# Patient Record
Sex: Female | Born: 1935 | Race: White | Hispanic: No | Marital: Married | Smoking: Never smoker
Health system: Southern US, Community
[De-identification: ages and names within clinical notes are randomized; demographics above are authoritative.]

## PROBLEM LIST (undated history)

## (undated) DIAGNOSIS — M810 Age-related osteoporosis without current pathological fracture: Secondary | ICD-10-CM

## (undated) DIAGNOSIS — J45909 Unspecified asthma, uncomplicated: Secondary | ICD-10-CM

## (undated) DIAGNOSIS — I341 Nonrheumatic mitral (valve) prolapse: Secondary | ICD-10-CM

## (undated) DIAGNOSIS — G20A1 Parkinson's disease without dyskinesia, without mention of fluctuations: Secondary | ICD-10-CM

## (undated) DIAGNOSIS — F419 Anxiety disorder, unspecified: Secondary | ICD-10-CM

## (undated) DIAGNOSIS — M419 Scoliosis, unspecified: Secondary | ICD-10-CM

## (undated) DIAGNOSIS — M1990 Unspecified osteoarthritis, unspecified site: Secondary | ICD-10-CM

## (undated) DIAGNOSIS — G20 Parkinson's disease: Secondary | ICD-10-CM

## (undated) DIAGNOSIS — I340 Nonrheumatic mitral (valve) insufficiency: Secondary | ICD-10-CM

## (undated) DIAGNOSIS — E785 Hyperlipidemia, unspecified: Secondary | ICD-10-CM

## (undated) DIAGNOSIS — Z889 Allergy status to unspecified drugs, medicaments and biological substances status: Secondary | ICD-10-CM

## (undated) HISTORY — DX: Scoliosis, unspecified: M41.9

## (undated) HISTORY — DX: Unspecified asthma, uncomplicated: J45.909

## (undated) HISTORY — DX: Hyperlipidemia, unspecified: E78.5

## (undated) HISTORY — PX: APPENDECTOMY: SHX54

## (undated) HISTORY — DX: Nonrheumatic mitral (valve) insufficiency: I34.0

## (undated) HISTORY — DX: Parkinson's disease: G20

## (undated) HISTORY — PX: HAMMER TOE SURGERY: SHX385

## (undated) HISTORY — DX: Parkinson's disease without dyskinesia, without mention of fluctuations: G20.A1

## (undated) HISTORY — PX: CHOLECYSTECTOMY: SHX55

## (undated) HISTORY — DX: Nonrheumatic mitral (valve) prolapse: I34.1

## (undated) HISTORY — DX: Unspecified osteoarthritis, unspecified site: M19.90

## (undated) HISTORY — PX: BLADDER SURGERY: SHX569

## (undated) HISTORY — PX: CARPAL TUNNEL RELEASE: SHX101

## (undated) HISTORY — PX: ABDOMINAL HYSTERECTOMY: SHX81

## (undated) HISTORY — DX: Allergy status to unspecified drugs, medicaments and biological substances: Z88.9

---

## 2001-01-28 ENCOUNTER — Encounter: Payer: Self-pay | Admitting: *Deleted

## 2001-01-28 ENCOUNTER — Ambulatory Visit (HOSPITAL_COMMUNITY): Admission: RE | Admit: 2001-01-28 | Discharge: 2001-01-28 | Payer: Self-pay | Admitting: *Deleted

## 2001-02-25 ENCOUNTER — Encounter: Payer: Self-pay | Admitting: *Deleted

## 2001-02-25 ENCOUNTER — Encounter: Admission: RE | Admit: 2001-02-25 | Discharge: 2001-02-25 | Payer: Self-pay | Admitting: *Deleted

## 2001-04-17 ENCOUNTER — Ambulatory Visit (HOSPITAL_COMMUNITY): Admission: RE | Admit: 2001-04-17 | Discharge: 2001-04-17 | Payer: Self-pay | Admitting: Cardiology

## 2003-07-26 ENCOUNTER — Encounter: Payer: Self-pay | Admitting: Family Medicine

## 2003-07-26 ENCOUNTER — Ambulatory Visit (HOSPITAL_COMMUNITY): Admission: RE | Admit: 2003-07-26 | Discharge: 2003-07-26 | Payer: Self-pay | Admitting: Family Medicine

## 2003-08-17 ENCOUNTER — Encounter: Payer: Self-pay | Admitting: General Surgery

## 2003-08-24 ENCOUNTER — Observation Stay (HOSPITAL_COMMUNITY): Admission: RE | Admit: 2003-08-24 | Discharge: 2003-08-25 | Payer: Self-pay | Admitting: General Surgery

## 2003-08-24 ENCOUNTER — Encounter: Payer: Self-pay | Admitting: General Surgery

## 2003-08-24 ENCOUNTER — Encounter (INDEPENDENT_AMBULATORY_CARE_PROVIDER_SITE_OTHER): Payer: Self-pay | Admitting: *Deleted

## 2004-09-19 ENCOUNTER — Ambulatory Visit: Payer: Self-pay | Admitting: Pulmonary Disease

## 2004-10-03 ENCOUNTER — Ambulatory Visit: Payer: Self-pay | Admitting: Pulmonary Disease

## 2004-12-05 ENCOUNTER — Ambulatory Visit: Payer: Self-pay | Admitting: Pulmonary Disease

## 2005-01-16 ENCOUNTER — Ambulatory Visit: Payer: Self-pay | Admitting: Pulmonary Disease

## 2005-07-10 ENCOUNTER — Ambulatory Visit: Payer: Self-pay | Admitting: Pulmonary Disease

## 2005-09-04 ENCOUNTER — Ambulatory Visit: Payer: Self-pay | Admitting: Pulmonary Disease

## 2006-01-13 ENCOUNTER — Ambulatory Visit: Payer: Self-pay | Admitting: Pulmonary Disease

## 2006-02-24 ENCOUNTER — Ambulatory Visit: Payer: Self-pay | Admitting: Pulmonary Disease

## 2006-06-09 ENCOUNTER — Ambulatory Visit: Payer: Self-pay | Admitting: Pulmonary Disease

## 2006-07-09 ENCOUNTER — Ambulatory Visit: Payer: Self-pay | Admitting: Pulmonary Disease

## 2006-08-26 ENCOUNTER — Ambulatory Visit: Payer: Self-pay | Admitting: Internal Medicine

## 2006-10-13 ENCOUNTER — Ambulatory Visit: Payer: Self-pay | Admitting: Pulmonary Disease

## 2006-10-13 ENCOUNTER — Ambulatory Visit: Payer: Self-pay | Admitting: Gynecology

## 2006-11-20 ENCOUNTER — Inpatient Hospital Stay (HOSPITAL_COMMUNITY): Admission: RE | Admit: 2006-11-20 | Discharge: 2006-11-22 | Payer: Self-pay | Admitting: Gynecology

## 2006-11-20 ENCOUNTER — Ambulatory Visit: Payer: Self-pay | Admitting: Gynecology

## 2006-11-27 ENCOUNTER — Ambulatory Visit: Payer: Self-pay | Admitting: Obstetrics & Gynecology

## 2006-12-22 ENCOUNTER — Ambulatory Visit: Payer: Self-pay | Admitting: Gynecology

## 2007-01-15 ENCOUNTER — Ambulatory Visit: Payer: Self-pay | Admitting: Pulmonary Disease

## 2007-05-18 ENCOUNTER — Ambulatory Visit: Payer: Self-pay | Admitting: Pulmonary Disease

## 2007-08-31 ENCOUNTER — Ambulatory Visit: Payer: Self-pay | Admitting: Pulmonary Disease

## 2007-12-15 ENCOUNTER — Encounter: Admission: RE | Admit: 2007-12-15 | Discharge: 2007-12-15 | Payer: Self-pay | Admitting: Neurology

## 2010-04-27 ENCOUNTER — Encounter: Admission: RE | Admit: 2010-04-27 | Discharge: 2010-04-27 | Payer: Self-pay | Admitting: Internal Medicine

## 2010-08-15 ENCOUNTER — Encounter: Admission: RE | Admit: 2010-08-15 | Discharge: 2010-08-15 | Payer: Self-pay | Admitting: Internal Medicine

## 2010-08-20 ENCOUNTER — Encounter: Admission: RE | Admit: 2010-08-20 | Discharge: 2010-08-20 | Payer: Self-pay | Admitting: Internal Medicine

## 2011-03-26 NOTE — Assessment & Plan Note (Signed)
Jill Hamilton                             PULMONARY OFFICE NOTE   Jill Hamilton, Jill Hamilton                     MRN:          [id number]  DATE:08/31/2007                            DOB:          [birth year]    I saw Jill Hamilton in followup for her chronic cough and on the basis of  gastroesophageal reflux disease.   She is currently on Zegerid 40 mg daily. She says that she has not  noticed much of a problem with her cough at all. She also denies any  chest pain, wheezing or dyspnea. She also denies any significant recent  infections or hemoptysis or sinus symptoms. She has not had any other  significant changes in her health since our last visit.   CURRENT MEDICATIONS:  1. Evista 60 mg daily.  2. Sinemet 50/200 one tablet t.i.d.  3. Zegerid 40 mg q nightly.   PHYSICAL EXAMINATION:  She is 125 pounds, temperature 97.5, blood  pressure 130/74. Heart rate 79. Oxygen saturation is 95% on room air.  GENERAL: He is awake, alert and oriented. Appears to be in good spirits  and does not appear to be in any kind of distress.  HEENT:  There is no sinus tenderness. There are no oral lesions.  HEART: Was S1, S2.  CHEST: Was no wheezing.  ABDOMEN: Soft and nontender.  EXTREMITIES: There was no edema.   IMPRESSION:  1. Chronic cough on the basis of gastroesophageal reflux disease.      Currently doing quite well on her regimen of Zegerid 40 mg q      nightly. I will have her continue on this. I have given her a      sample of this.  2. I have administered an influenza vaccination for her in my office      today.   I will followup with her in approximately 6 months.     Coralyn Helling, MD  Electronically Signed    VS/MedQ  DD: 08/31/2007  DT: 08/31/2007  Job #: [id number]   cc:   Casimiro Needle L. Thad Ranger, M.D.  Robert A. Nicholos Johns, M.D.

## 2011-03-26 NOTE — Assessment & Plan Note (Signed)
[facility]                             PULMONARY OFFICE NOTE   NICOLENA, SCHURMAN                     MRN:          [id number]  DATE:05/18/2007                            DOB:          September 18, 1934    I saw Ms. Presas today in followup for her chronic cough on the basis  of gastroesophageal reflux.   She continues to use Zegerid 40 mg at night when she has this or Aciphex  20 mg at night.  She is doing reasonably well with the symptoms.  She  says she has an occasional cough, but nothing nearly as severe as what  it was before.  She no longer is using her Spiriva and she feels that  she does not need it.  She says that she used to have problems with her  sleep, associated with a reflux, but these have improved since she has  lost approximately 40 pounds.   CURRENT MEDICATIONS:  1. Evista 60 mg daily.  2. Sinemet 50/200 mg b.i.d.  3. Zegerid 40 mg nightly.  4. Or Aciphex 20 mg nightly   PHYSICAL EXAMINATION:  She is 128 pounds.  Temperature is 97.9.  Blood  pressure is 118/70.  Heart rate is 90.  Oxygen saturation 99% on room  air.  HEENT:  There is no sinus tenderness, no oral lesions, no  lymphadenopathy.  HEART:  S1, S2.  CHEST:  Clear to auscultation.  ABDOMEN:  Soft and nontender.  EXTREMITIES:  No edema.   IMPRESSION:  Chronic cough on the basis of gastroesophageal reflux  disease, currently well controlled on Zegerid. Again she does not notice  any benefit from the use of Prilosec. I have given her samples for both  Zegerid and Aciphex as well as prescriptions.   I will follow up with her in approximately 3 to 4 months.     Coralyn Helling, MD  Electronically Signed    VS/MedQ  DD: 05/18/2007  DT: 05/19/2007  Job #: [id number]   cc:   Casimiro Needle L. Thad Ranger, M.D.  Robert A. Nicholos Johns, M.D.

## 2011-03-29 NOTE — Assessment & Plan Note (Signed)
[facility]                               PULMONARY OFFICE NOTE   NOEL, HENANDEZ                     MRN:          [id number]  DATE:07/09/2006                            DOB:          17-Apr-1934    The patient presents today for followup.  We last saw her on June 09, 2006,  at that time she had been having difficulty with LPR mediated cough.  She  had been having difficulty also with not good control of her reflux on her  proton pump inhibitor.  She has the compounding issue of having Parkinson  disease and possibly some esophageal dysmotility associated with the same.  The patient was previously on Nexium twice a day.  She was switched during  her last visit to Zegerid at bedtime.  Since then, she has found that this  works much better for her reflux.  Her cough is now also basically  nonexistent.  She was on Spiriva also which helped her with the cough.  This  now has been eliminated by the patient as she is without any symptoms on  Zegerid.   The patient voices no other complaints today.   CURRENT MEDICATIONS:  Are as noted on the intake sheet.  These have been  reviewed and are accurate.   PHYSICAL EXAMINATION:  VITAL SIGNS:  As noted.  Oxygen saturation is 96% on  room air.  GENERAL:  This is a thin white female who is in no acute distress.  She does  have a resting tremor associated with Parkinson's.  HEENT:  Remarkable for micrognathia, otherwise unremarkable.  NECK:  Supple.  No adenopathy noted.  No JVD.  LUNGS:  Clear.  CARDIAC:  Regular rate and rhythm.  No rubs, murmurs, gallops.  EXTREMITIES:  The patient has no cyanosis, no clubbing, no edema noted.   IMPRESSION:  Cough mediated by laryngopharyngeal reflux.  This now markedly  improved on Zegerid.   PLAN:  The patient to continue Zegerid 40 mg at bedtime.   FOLLOWUP:  In 3 months' time.  She is to contact us prior to that time  should any problems arise.                           Gailen Shelter, MD   CLG/MedQ  DD:  07/09/2006  DT:  07/09/2006  Job #:  [id number]   cc:   Santina Evans A. Orlin Hilding, MD

## 2011-03-29 NOTE — Discharge Summary (Signed)
Jill Hamilton, Jill Hamilton              ACCOUNT NO.:  [id number]   MEDICAL RECORD NO.:  [id number]          PATIENT TYPE:  INP   LOCATION:  9315                          FACILITY:  [facility]   PHYSICIAN:  Ginger Carne, MD  DATE OF BIRTH:  1934-02-14   DATE OF ADMISSION:  11/20/2006  DATE OF DISCHARGE:  11/22/2006                               DISCHARGE SUMMARY   REASON FOR HOSPITALIZATION:  Complete vaginal vault procidentia.   IN-HOSPITAL PROCEDURES:  Abdominal sacrocolpopexy, cystoscopy and Burch  colposuspension.   FINAL DIAGNOSIS:  Complete vaginal vault procidentia.   HOSPITAL COURSE:  This is a 77 year old Caucasian female who underwent  the procedure on November 20, 2006 because of complete vaginal  procidentia.  Her postoperative course was uneventful.  She was  afebrile.  Hemoglobin postoperatively 9.6, creatinine 0.9.  Abdomen was  soft and incision was dry.  No vaginal drainage and lungs were clear and  calves without tenderness.  The patient had her Foley catheter removed  in the morning of said discharge and voided 200 mL without residual  pressure or discomfort.   The patient was discharged with routine postoperative instructions  including contacting the office for temperature elevation above 100.4  degrees Fahrenheit, increasing incisional pain, drainage, erythema,  vaginal bleeding, difficulty voiding, pressure with voiding and/or  urinary retention.  Time to void every four hours were discussed with  the patient for the next four weeks and to avoid evening hour caffeine.  The patient was instructed to take Percocet 5-325 one to two every four  to six hours for pain, Citrucel and Milk of Magnesia as needed for  constipation.  She will return in five days to have her staples removed  and four weeks for her postoperative visit.  All questions answered to  the satisfactory of said patient and the patient verbalized  understanding of instructions.   The patient will  resume all preoperative medications.      Ginger Carne, MD  Electronically Signed     SHB/MEDQ  D:  11/22/2006  T:  11/22/2006  Job:  [id number]

## 2011-03-29 NOTE — Letter (Signed)
January 15, 2007    [facility]   RE:  Jill, Hamilton  MRN:  [id number]  /  DOB:  [birth year]   To Whom It May Concern:   This letter is with regards to the above captioned patient. Jill Hamilton  is a 77 year old female I have followed at [facility] since August 2003 at  which time she was evaluated for a chronic cough. Evaluation of the  patient revealed that the patient had been tried on numerous medications  for her cough. It was determined that her cough was due to  gastroesophageal reflux secondary to a laryngopharyngeal component. At  that time, the patient was on numerous asthma medications and also had  tried numerous proton pump inhibitors. One of the proton pump inhibitors  tried at that time reviewing my records show that it was Prevacid. This  had no effect on her gastroesophageal reflux symptoms nor on her cough.  Subsequently the patient was tried on Nexium. She has been tried on  generic omeprazole, both at high doses twice a day and again with no  success. More recently, the patient has been placed Zegerid 40 mg at  bedtime. This medication has relieved her symptoms of gastroesophageal  reflux and at the same time has ameliorated her cough. Note is made that  with the addition of this medication she was also tapered off of a lot  of other medications that were required to control her symptoms  including multiple inhalers, Singulair and other medications. Her  current list of medications is now quite manageable and modest.   For this reason, I would appreciate if Zegerid 40 mg at bedtime be  approved for this patient as it controls her symptoms beautifully and  she does not require high-dose proton pump inhibitor and is managed with  a bedtime dose.   Should you have any further questions with regards to this case, please  do not hesitate to contact us.    Sincerely,      C. Danice Goltz, MD  Electronically Signed    CLG/MedQ  DD: 01/15/2007  DT:  01/15/2007  Job #: [id number]

## 2011-03-29 NOTE — Op Note (Signed)
Jill Hamilton, Jill Hamilton                          ACCOUNT NO.:  [id number]   MEDICAL RECORD NO.:  [id number]                   PATIENT TYPE:  OBV   LOCATION:  0368                                 FACILITY:  [facility]   PHYSICIAN:  Ollen Gross. Vernell Morgans, M.D.              DATE OF BIRTH:  Mar 01, 1934   DATE OF PROCEDURE:  08/24/2003  DATE OF DISCHARGE:                                 OPERATIVE REPORT   PREOPERATIVE DIAGNOSIS:  Gallstones.   POSTOPERATIVE DIAGNOSIS:  Gallstones.   PROCEDURE:  Laparoscopic cholecystectomy with intraoperative cholangiogram.   SURGEON:  Ollen Gross. Carolynne Edouard, M.D.   ASSISTANT:  Currie Paris, M.D.   ANESTHESIA:  General endotracheal.   DESCRIPTION OF PROCEDURE:  After informed consent was obtained, the patient  was brought to the operating room, placed in supine position on the  operating table. After adequate induction of general anesthesia, the  patient's abdomen was prepped with Betadine and draped in the usual sterile  manner. The area below the umbilicus was infiltrated with 0.25% Marcaine, a  small incision was made with a 15 blade knife, this incision was carried  down through the subcutaneous tissue bluntly using the Kelly clamp and [employer] retractors until the linea alba was identified. The linea alba was  incised with a 15 blade knife and each side was grasped with Kocher clamps  and elevated anteriorly. The preperitoneal space was then probed bluntly  with the hemostat until the peritoneum was opened and access was gained to  the abdominal cavity. A #0 Vicryl pursestring stitch was placed in the  fascia surrounding the opening, Hasson cannula was placed through the  opening and anchored in place with the previously placed Vicryl pursestring  stitch. The abdomen was then insufflated with carbon dioxide without  difficulty. The patient was placed in a head up position, the laparoscope  was placed through the Hasson cannula. The right upper quadrant  was  inspected, the dome of the gallbladder and liver were readily identified.  The epigastric region was then infiltrated with 0.25% Marcaine, small  incision was made with a 15 blade knife and a 10 mm port was placed bluntly  through this incision into the abdominal cavity under direct vision. Sites  were then chosen laterally on the right side of the abdomen for placement of  5 mm ports, each of these areas was infiltrated with 0.25% Marcaine. Small  stab incisions were made with the 15 blade knife. 5 mm ports were placed  bluntly through these incisions into the abdominal cavity under direct  vision. A blunt grasper was then placed through the lateral most 5 mm port  and used to grasp the dome of the gallbladder and elevate it anteriorly and  superiorly. Another blunt grasper was placed through the other 5 mm port and  used to retract on the body and neck of the gallbladder. A dissector was  placed through the epigastric port and using the electrocautery, the  peritoneal reflection at the gallbladder neck was opened. Blunt dissection  was then carried out in this area until the gallbladder neck, cystic duct  junction was readily identified and a good window was created. A clip was  then placed on the gallbladder neck, a small ductotomy was made with the  laparoscopic scissors. A 15 gauge angiocath was then placed percutaneously  through the anterior abdominal wall under direct vision. The Reddick  cholangiogram catheter was placed through the angiocath and flushed. The  Reddick catheter was then placed within the cystic duct and anchored in  place with the clip. A cholangiogram was obtained that showed good emptying  into the duodenum, no filling defects and adequate length on the cystic  duct. The anchoring clip and catheter was then removed. Three clips were  placed proximally on the cystic duct and the duct was divided between the  two sets of clips. Posterior to this, the cystic  artery was identified and  again dissected bluntly in a circumferential manner until a good window was  created. Two clips were placed proximally and one distally on the artery and  the artery was divided between the two. Next a laparoscopic hook cautery  device was used to separate the gallbladder from the liver bed. Prior to  completely detaching the gallbladder from the liver bed, the liver bed was  inspected and several small bleeding points were coagulated with the  electrocautery. The gallbladder was then detached the rest of the way from  the liver bed without difficulty. The abdomen was then irrigated with  copious amounts of saline until the effluent was clear. The laparoscope was  then moved to the epigastric port, a gallbladder grasper was placed through  the Hasson cannula and used to grasp the neck of the gallbladder. The  gallbladder was then removed with the Hasson cannula through the  infraumbilical port without difficulty. The fascial defect was closed with  the previously Vicryl pursestring stitch. The rest of the ports were removed  under direct vision and were all found to be hemostatic. The gas was allowed  to escape. The skin incisions were all closed with interrupted 4-0 Monocryl  subcuticular stitches. Benzoin and Steri-Strips were applied. The patient  tolerated the procedure well. At the end of the case, all sponge, needle and  instrument counts were correct. The patient was then awakened and taken to  the recovery room in stable condition.                                               Ollen Gross. Vernell Morgans, M.D.    PST/MEDQ  D:  08/24/2003  T:  08/24/2003  Job:  [id number]

## 2011-03-29 NOTE — Assessment & Plan Note (Signed)
[facility]                             PULMONARY OFFICE NOTE   KATTALEYA, ALIA                     MRN:          [id number]  DATE:01/15/2007                            DOB:          1934-10-27    This is a very pleasant 77 year old white female who follows here for  chronic cough and gastroesophageal reflux with a laryngopharyngeal  component.  She also has Parkinson's followed by Dr. Marcelino Freestone.  The patient presents today for followup.  Her cough is actually well  controlled with the use of Zegerid at nighttime.  She also uses Spiriva  occasionally when her cough flares up.  This seems to work really well  in controlling her cough spasms.  She has not had one of these since her  last visit in December 2007.   CURRENT MEDICATIONS:  As noted on the intake sheet.  These have been  reviewed, and are accurate.   PHYSICAL EXAMINATION:  VITAL SIGNS:  Noted oxygen saturation is 92% on  room air.  GENERAL:  This is a well-developed, thin female who is in no acute  distress.  HEENT:  Examination was remarkable for micrognathia.  NECK:  Supple.  No adenopathy noted.  No JVD.  LUNGS:  Clear to auscultation bilaterally.  CARDIAC EXAMINATION:  Regular rate and rhythm.  No rubs, murmurs or  gallops heard.  EXTREMITIES:  The patient has no cyanosis, no clubbing, no edema noted.  NEUROLOGIC EXAMINATION:  The patient does not have resting tremor today.   IMPRESSION:  Gastroesophageal reflux with laryngopharyngeal component  and cough secondary to the same.  Patient currently well compensated on  Zegerid at bedtime once a day.  Patient also controlled with occasional  use of Spiriva.   PLAN:  1. Will be for the patient to continue medications as they are.  2. We will have her follow up in 3 months' time.  At that time, she      will see my partner, Dr. Coralyn Helling, as I will be leaving the      practice.  The patient was notified of  this.     Gailen Shelter, MD  Electronically Signed    CLG/MedQ  DD: 01/15/2007  DT: 01/15/2007  Job #: [id number]   cc:   Santina Evans A. Orlin Hilding, M.D.  Robert A. Nicholos Johns, M.D.

## 2011-03-29 NOTE — Cardiovascular Report (Signed)
[place]  Patient:    Jill Hamilton, Jill Hamilton                       MRN: [id number]. Date: 04/17/01 Attending:  Armanda Magic, M.D. CC:         Willis Modena. Dreiling, M.D.                        Cardiac Catheterization  REFERRING PHYSICIAN:  Willis Modena. Dreiling, M.D.  PROCEDURES PERFORMED:  Left heart catheterization.  INDICATIONS:  Dyspnea and abnormal Cardiolite.  COMPLICATIONS:  None.  IV ACCESS:  Via 6 French sheath in right femoral artery.  HISTORY OF PRESENT ILLNESS:  This is a 77 year old, very pleasant white female, who has been experiencing very severe lower extremity edema for several months now.  She has also noted increasing dyspnea on exertion as well as PND.  She did have an episode of vague chest pain described as a weight on her chest recently and underwent stress Cardiolite study which showed an abnormal area of decreased perfusion in the septum from the mid LV to the base of the heart which resolved with some improvement in perfusion during resting images.  She now presents for cardiac catheterization.  DESCRIPTION OF PROCEDURE:  The patient is brought to the cardiac catheterization laboratory in a fasting, nonsedated state.  Informed consent was obtained.  The patient was connected to continuous heart rate and pulse oximetry monitoring, and intermittent blood pressure monitoring.  The right groin was prepped and draped in a sterile fashion.  Xylocaine 1% was used for local anesthesia.  Using the modified Seldinger technique a 6 French sheath was placed in the right femoral artery.  Under fluoroscopic guidance a 6 [place] JL4 catheter was placed in the left coronary artery.  Multiple cine films were taken in the RAO 30 degree, LAO 40 degree views.  This catheter was then exchanged out over a guide wire for a 6 [place] JR4 catheter which was placed in the right coronary artery under fluoroscopic guidance.  Multiple cine films were taken  at a 30 degree RAO, 40 degree LAO views.  This catheter was then exchanged out over a guide wire for a 6 French angled pigtail catheter which was placed under fluoroscopic guidance in the left ventricular cavity.  Left ventriculography was performed in a 30 degree RAO view using a total of 24 cc of contrast at 13 cc/sec.  This catheter was then pulled back across the aortic valve.  No significant pressure gradient was obtained.  The catheter was then removed over a guide wire.  At the end of the procedure all catheters and sheaths were removed.  Manual compression was performed until adequate hemostasis was obtained.  The patient was transferred back to the room in stable condition.  RESULTS: 1. The left main coronary artery is widely patent throughout its course and    bifurcates in the left anterior descending artery and left circumflex 2. The left anterior descending artery is widely patent throughout its    course and gives rise to one small first diagonal branching vessel which    is widely patent.  It also gives rise to a second diagonal branch which is    widely patent. 3. Left circumflex gives rise to a large branching obtuse marginal which is    widely patent throughout its course.  The rest of the left circumflex    traversed the AV  groove and is widely patent. 4. Right coronary artery is widely patent throughout its course and    bifurcates into a posterior descending artery and posterolateral    artery, both of which are widely patent.  LEFT VENTRICULOGRAM:  The left ventriculography performed in a 30 degree RAO view using 24 cc of contrast at 12 cc/sec. showed mild hypokinesis of the mid inferior wall but overall EF 50-55%.  Left ventricular pressure 130/1        8 mmHg, aortic pressure 127/66 mmHg.  ASSESSMENT: 1. Normal coronary arteries. 2. Normal left ventricular function.  PLAN:  Discharge home later today. DD:  04/17/01 TD:  04/17/01 Job: [id number]

## 2011-03-29 NOTE — Op Note (Signed)
Jill Hamilton, Jill Hamilton              ACCOUNT NO.:  [id number]   MEDICAL RECORD NO.:  [id number]          PATIENT TYPE:  INP   LOCATION:  9315                          FACILITY:  [facility]   PHYSICIAN:  Ginger Carne, MD  DATE OF BIRTH:  1934/08/06   DATE OF PROCEDURE:  11/20/2006  DATE OF DISCHARGE:                               OPERATIVE REPORT   PREOPERATIVE DIAGNOSIS:  Complete vaginal procidentia.   POSTOPERATIVE DIAGNOSIS:  Complete vaginal procidentia.   PROCEDURE:  Abdominal sacral colpopexy, cystoscopy and Burch  colposuspension.   SURGEON:  Ginger Carne, MD.   ASSISTANTMichele Mcalpine D. Rose, M.D.   ESTIMATED BLOOD LOSS:  Less than 75 mL.   COMPLICATIONS:  None immediate.   SPECIMEN:  None.   ANESTHESIA:  General.   OPERATIVE FINDINGS:  The patient demonstrated complete vaginal  procidentia.  The cystoscopy revealed no evidence of injury to the  bladder and no sutures within the bladder.   OPERATIVE PROCEDURE:  The patient prepped and draped in usual fashion  and placed in lithotomy position.  Noniodine solution used for  antiseptic and the patient was catheterized prior to the procedure.  After adequate general anesthesia, vertical infraumbilical incision was  made and the abdomen opened.  Appropriate packing with Bookwalter  retractors utilized.  The visceral peritoneum overlying L5-S1 was opened  in the midline.  Attention was paid to avoid the middle sacral vessels  as well as the right ureter and the large great vessels including the  iliac artery vein on either side.  A 0 Prolene suture was used on either  side of the anterior vertebral ligament for a total of two sutures.  Following this, the peritoneum overlying the vaginal cuff was dissected  and opened.  A vaginal probe was used to facilitate this dissection.  Polypropylene mesh was utilized for appropriate material and this was  affixed to the posterior aspect of the denuded vaginal wall with 2-0  Prolene  interrupted sutures.  After appropriate fixation and appropriate  angulation, the cephalad portion of the mesh was attached to the  previously placed Prolene sutures at L5-S1 on the anterior vertebral  ligament.  Appropriate tensioning was applied and the sutures over the  vertebral ligament were tied down.  The anterior portion of the mesh was  then affixed to the vagina on the anterior vaginal wall with 2-0 Prolene  interrupted sutures.  The visceral peritoneum was then closed throughout  with 2-0 Vicryl running suture.  Copious irrigation with lactated  Ringer's followed.  No kinking of bowel noted.  No denuded areas were  noted to allow the mesh to be visible.  At this point, the Burch  colposuspension was performed.  Dissection of the space of Retzius was  conducted, 0 Prolene sutures were then placed, two on either side 1 cm  lateral and 2 cm lateral to the urethra.  These were affixed to the  lacunar ligament and appropriately tied down and tensioned.  Bleeding  points hemostatically checked, more irrigation utilized.  Closure of the  fascia in one layer with 0 PDS  double  loop suture and skin staples for the skin.  Cystoscopy performed  at the end of the procedure.  No evidence of violation of the bladder or  injury was noted.  No sutures were noticed.  The patient tolerated the  procedure well, returned to the post anesthesia recovery room in  excellent condition.  Urine clear at the end of the procedure.      Ginger Carne, MD  Electronically Signed     SHB/MEDQ  D:  11/20/2006  T:  11/20/2006  Job:  [id number]

## 2011-03-29 NOTE — Assessment & Plan Note (Signed)
[facility]                               PULMONARY OFFICE NOTE   LISVET, RASHEED                     MRN:          [id number]  DATE:06/09/2006                            DOB:          20-Sep-1934    REASON FOR VISIT:  Ms. Jill Hamilton is a very pleasant 77 year old white female  who follows here as a three month visit for cough.  The patient has issues  with Parkinson's disease.  She has cough that is clearly mediated by  laryngopharyngeal reflux and gastroesophageal reflux.  She has required  twice a day proton pump inhibitor.  Whenever proton pump inhibitors are  reduced to once daily, the patient recurs in her cough symptoms.  She was  markedly better with promotility agents, namely Zelnorm; however, this has  since been removed from the market in the [place] and the patient  unfortunately recurred in her symptoms after being off this medication.  She  is currently on Nexium twice a day but continues to have difficulties with  cough.  She has had to resume her Spiriva and Mucinex DM which she used to  use in the past for control of her LPR mediated cough.   CURRENT MEDICATIONS:  As noted on the intake sheet.  These have been  reviewed and are accurate.   PHYSICAL EXAMINATION:  VITAL SIGNS:  As noted.  Oxygen saturation is 93% on  room air.  GENERAL:  This is a thin, chronically ill-appearing female who is in no  acute distress.  She does have occasional resting tremor noted.  HEENT:  Micrognathia.  Otherwise unremarkable.  NECK:  Supple.  No adenopathy noted.  No JVD.  LUNGS:  Clear to auscultation bilaterally.  CARDIAC:  Regular rate and rhythm.  No rubs, murmurs, or gallops.  EXTREMITIES:  The patient has no cyanosis, no clubbing, and no edema noted.   IMPRESSION:  Recurrent cough, likely secondary to recurrent gastroesophageal  reflux symptoms.   PLAN:  1.  Plan will be for the patient to continue proton pump inhibitor.  I will      try  to consolidate her proton pump inhibitor and place her on Zegerid 40      mg at bedtime, as her symptoms are worse in the nighttime.  2.  Continue Spiriva for now, one capsule inhaled daily.  3.  Continue use of Mucinex DM.  4.  Follow up will be in two to four weeks' time.  She is to contact us      prior to that time should any new problems arise.                                  Gailen Shelter, MD   CLG/MedQ  DD:  06/09/2006  DT:  06/10/2006  Job #:  [id number]

## 2011-03-29 NOTE — Assessment & Plan Note (Signed)
 [facility]                             PULMONARY OFFICE NOTE   SERENIDY, Jill Hamilton                     MRN:          [id number]  DATE:10/13/2006                            DOB:          1933-11-27    This is a very pleasant, 77 year old white female who I follow here for  a cough.  This is due to gastroesophageal reflux.  She is currently well-  compensated in this regard.  Her main issue currently is the patient  having developed Parkinson's.  She is currently on Sinemet.  She is  being followed by Dr. Orlin Hilding of neurology for the same.  She will need  to be monitored for development of dysphagia, a common complication of  Parkinson's.   The patient, again, voiced no active complaints today.   The patient does request Ventolin to have for p.r.n. use.  She usually  uses this if she has paroxysm of cough and it seems to work well for  her.  She really has not needed to use this medication hardly at all.   The patient is up to date on flu vaccine.   CURRENT MEDICATIONS:  As noted on the intake sheet.  These have been  reviewed and are accurate.  Her only current active medications for her  cough are Zegerid 40 mg at bedtime for control of her gastroesophageal  reflux.   PHYSICAL EXAM:  VITALS:  As noted.  Oxygen saturation was 95% on room  air.  GENERAL:  This is a well-developed, well-nourished, elderly female who  was in no acute distress.  She does have a mild resting tremor and head  bobbing typical of Parkinson's.  HEENT:  Unremarkable with the exception of poor dentition.  NECK:  Supple.  No adenopathy noted.  No JVD.  LUNGS:  Clear to auscultation bilaterally.  CARDIAC:  Regular rate and rhythm.  No rubs, murmurs, or gallops heard.  EXTREMITIES:  The patient has no cyanosis, no clubbing, no edema noted.   IMPRESSION:  Cough due to gastroesophageal reflux is well controlled.   PLAN:  1. The patient was given a prescription for p.r.n.  Ventolin.  2. Continue Zegerid at bedtime for her reflux.  3. Followup will be in 4-6 months' time.  She is to contact us prior      to that time should any problems arise.     Gailen Shelter, MD  Electronically Signed    CLG/MedQ  DD: 10/27/2006  DT: 10/27/2006  Job #: [id number]

## 2011-03-29 NOTE — H&P (Signed)
Jill Hamilton, Jill Hamilton              ACCOUNT NO.:  [id number]   MEDICAL RECORD NO.:  [id number]          PATIENT TYPE:  AMB   LOCATION:  SDC                           FACILITY:  [facility]   PHYSICIAN:  Ginger Carne, MD  DATE OF BIRTH:  Apr 04, 1934   DATE OF ADMISSION:  11/20/2006  DATE OF DISCHARGE:                              HISTORY & PHYSICAL   REASON FOR HOSPITALIZATION:  Complete procidentia.   HISTORY OF PRESENT ILLNESS:  This patient is a 77 year old gravida 1,  para 1-0-0-1, Caucasian female who has had a 1-1/2-year history of  vaginal bulging and discomfort.  The patient states that she has  occasional loss of urine on straining and other Valsalva maneuvers but  denies symptoms of an overactive bladder.  She does not have fecal  incontinence, does not have to splint to effect a bowel movement but  does have to strain.  The patient does use laxatives for chronic  constipation.  The patient states that the discomfort as a result of her  vaginal prolapse has worsened to the point that it has had a negative  affect in her quality of life.  The patient is sexually active and  denies dyspareunia with lubrication.  She had used estrogen replacement  therapy in the past but had discontinued this 2 years ago.   OB/GYN HISTORY:  The patient had an abdominal hysterectomy in 1978 for  fibroids.  To the best of the patient's knowledge, both ovaries have  remained in place.  She is also had a laparoscopic cholecystectomy in  2006.   ALLERGIES:  IODINE.   CURRENT MEDICATIONS:  1. Sinemet 5/200 mg, the patient takes one three times a day.  2. Evista 60 mg daily.  3. Naproxen 550 mg one twice a day.  4. Zegerid 40 mg daily.  5. Tramadol 50 mg daily.  6. Xanax 0.5 mg as needed.  7. Zetia  10 mg daily.   MEDICAL HISTORY:  The patient has a history of Parkinson disease,  gastroesophageal reflux disease, and has asthma as well.  Approximately  5 years ago the patient had a cardiac  catheterization, which revealed no  evidence of coronary artery heart disease.  She denies exertional pain  or pain or anginal pain at rest.   SURGICAL HISTORY:  Cholecystectomy in 2006.   SOCIAL HISTORY:  The patient denies smoking, alcohol, or illicit drug  abuse.   A 10-point comprehensive review of systems within normal limits.   PHYSICAL EXAMINATION:  VITAL SIGNS:  Blood pressure 120/66, weight 130  pounds, height 5 feet 6 inches.  HEENT:  Grossly normal  BREASTS:  Without masses, discharge, thickenings or tenderness.  CHEST: Clear to percussion and auscultation.  CARDIOVASCULAR:  Without murmurs or enlargements.  Regular rate and  rhythm.  Extremities, lymphatic, skin, neurologic, musculoskeletal systems within  normal limits.   ABDOMEN:  Soft without gross hepatosplenomegaly.  PELVIC:  External genitalia, vulva and vagina reveals total vault  prolapse.  A disarticulated speculum utilized for examination as well.  Rectovaginal exam confirmatory.  Both adnexa are palpable, found to be  normal.  Lack of rugae present on vaginal epithelium.  RECTAL:  Hemoccult-negative without masses and confirms rectal exam.   Residual urine volume is 19 mL.  Urinalysis is negative.   IMPRESSION:  Complete vaginal vault prolapse with mild symptoms of  genuine urinary stress incontinence.   PLAN:  The patient is an ideal candidate for an abdominal sacrocolpopexy  with a Burch colposuspension and cystoscopy.  I indicated to the patient  that her ovaries were present and I would remove these to avoid any need  for removal of a later time and to reduce her risk of ovarian carcinoma.  The nature of said operation was discussed in detail.  Possible risks  including injuries to ureter, bowel and bladder, possible hemorrhage  requiring blood transfusion, infection, graft rejection, erosion or  rejection, were discussed in addition to failure of said procedure.  She  understands that a Burch  colposuspension carries the same risks  including possibly postoperative urinary retention and urgency.  Ashby Dawes  of said procedure discussed in detail, all questions answered to the  satisfaction of said patient, and the patient verbalized the nature of  said operation.      Ginger Carne, MD  Electronically Signed     SHB/MEDQ  D:  11/14/2006  T:  11/14/2006  Job:  [id number]   cc:   Gerald Leitz, MD

## 2011-11-14 DIAGNOSIS — J309 Allergic rhinitis, unspecified: Secondary | ICD-10-CM | POA: Diagnosis not present

## 2011-11-18 DIAGNOSIS — Z09 Encounter for follow-up examination after completed treatment for conditions other than malignant neoplasm: Secondary | ICD-10-CM | POA: Diagnosis not present

## 2011-11-18 DIAGNOSIS — J309 Allergic rhinitis, unspecified: Secondary | ICD-10-CM | POA: Diagnosis not present

## 2011-11-18 DIAGNOSIS — R920 Mammographic microcalcification found on diagnostic imaging of breast: Secondary | ICD-10-CM | POA: Diagnosis not present

## 2011-11-25 DIAGNOSIS — J309 Allergic rhinitis, unspecified: Secondary | ICD-10-CM | POA: Diagnosis not present

## 2011-12-02 DIAGNOSIS — J209 Acute bronchitis, unspecified: Secondary | ICD-10-CM | POA: Diagnosis not present

## 2011-12-02 DIAGNOSIS — J45909 Unspecified asthma, uncomplicated: Secondary | ICD-10-CM | POA: Diagnosis not present

## 2011-12-02 DIAGNOSIS — J3089 Other allergic rhinitis: Secondary | ICD-10-CM | POA: Diagnosis not present

## 2011-12-02 DIAGNOSIS — K219 Gastro-esophageal reflux disease without esophagitis: Secondary | ICD-10-CM | POA: Diagnosis not present

## 2011-12-09 DIAGNOSIS — J309 Allergic rhinitis, unspecified: Secondary | ICD-10-CM | POA: Diagnosis not present

## 2011-12-18 DIAGNOSIS — J309 Allergic rhinitis, unspecified: Secondary | ICD-10-CM | POA: Diagnosis not present

## 2011-12-26 DIAGNOSIS — J309 Allergic rhinitis, unspecified: Secondary | ICD-10-CM | POA: Diagnosis not present

## 2012-01-01 DIAGNOSIS — J309 Allergic rhinitis, unspecified: Secondary | ICD-10-CM | POA: Diagnosis not present

## 2012-01-02 DIAGNOSIS — J309 Allergic rhinitis, unspecified: Secondary | ICD-10-CM | POA: Diagnosis not present

## 2012-01-08 DIAGNOSIS — J309 Allergic rhinitis, unspecified: Secondary | ICD-10-CM | POA: Diagnosis not present

## 2012-01-13 DIAGNOSIS — J309 Allergic rhinitis, unspecified: Secondary | ICD-10-CM | POA: Diagnosis not present

## 2012-01-13 DIAGNOSIS — M1990 Unspecified osteoarthritis, unspecified site: Secondary | ICD-10-CM | POA: Diagnosis not present

## 2012-01-13 DIAGNOSIS — D649 Anemia, unspecified: Secondary | ICD-10-CM | POA: Diagnosis not present

## 2012-01-13 DIAGNOSIS — G20 Parkinson's disease: Secondary | ICD-10-CM | POA: Diagnosis not present

## 2012-01-13 DIAGNOSIS — R1906 Epigastric swelling, mass or lump: Secondary | ICD-10-CM | POA: Diagnosis not present

## 2012-01-13 DIAGNOSIS — K219 Gastro-esophageal reflux disease without esophagitis: Secondary | ICD-10-CM | POA: Diagnosis not present

## 2012-01-22 DIAGNOSIS — J309 Allergic rhinitis, unspecified: Secondary | ICD-10-CM | POA: Diagnosis not present

## 2012-01-29 DIAGNOSIS — M549 Dorsalgia, unspecified: Secondary | ICD-10-CM | POA: Diagnosis not present

## 2012-01-29 DIAGNOSIS — G5610 Other lesions of median nerve, unspecified upper limb: Secondary | ICD-10-CM | POA: Diagnosis not present

## 2012-01-29 DIAGNOSIS — J309 Allergic rhinitis, unspecified: Secondary | ICD-10-CM | POA: Diagnosis not present

## 2012-01-29 DIAGNOSIS — M5137 Other intervertebral disc degeneration, lumbosacral region: Secondary | ICD-10-CM | POA: Diagnosis not present

## 2012-02-05 DIAGNOSIS — J309 Allergic rhinitis, unspecified: Secondary | ICD-10-CM | POA: Diagnosis not present

## 2012-02-12 DIAGNOSIS — J309 Allergic rhinitis, unspecified: Secondary | ICD-10-CM | POA: Diagnosis not present

## 2012-02-19 DIAGNOSIS — J309 Allergic rhinitis, unspecified: Secondary | ICD-10-CM | POA: Diagnosis not present

## 2012-02-27 DIAGNOSIS — M5137 Other intervertebral disc degeneration, lumbosacral region: Secondary | ICD-10-CM | POA: Diagnosis not present

## 2012-02-27 DIAGNOSIS — J309 Allergic rhinitis, unspecified: Secondary | ICD-10-CM | POA: Diagnosis not present

## 2012-02-27 DIAGNOSIS — M549 Dorsalgia, unspecified: Secondary | ICD-10-CM | POA: Diagnosis not present

## 2012-03-06 DIAGNOSIS — J309 Allergic rhinitis, unspecified: Secondary | ICD-10-CM | POA: Diagnosis not present

## 2012-03-06 DIAGNOSIS — M5137 Other intervertebral disc degeneration, lumbosacral region: Secondary | ICD-10-CM | POA: Diagnosis not present

## 2012-03-11 DIAGNOSIS — J309 Allergic rhinitis, unspecified: Secondary | ICD-10-CM | POA: Diagnosis not present

## 2012-03-16 DIAGNOSIS — E7800 Pure hypercholesterolemia, unspecified: Secondary | ICD-10-CM | POA: Diagnosis not present

## 2012-03-16 DIAGNOSIS — F4321 Adjustment disorder with depressed mood: Secondary | ICD-10-CM | POA: Diagnosis not present

## 2012-03-16 DIAGNOSIS — J309 Allergic rhinitis, unspecified: Secondary | ICD-10-CM | POA: Diagnosis not present

## 2012-03-16 DIAGNOSIS — R634 Abnormal weight loss: Secondary | ICD-10-CM | POA: Diagnosis not present

## 2012-03-17 DIAGNOSIS — M5137 Other intervertebral disc degeneration, lumbosacral region: Secondary | ICD-10-CM | POA: Diagnosis not present

## 2012-03-26 DIAGNOSIS — J309 Allergic rhinitis, unspecified: Secondary | ICD-10-CM | POA: Diagnosis not present

## 2012-04-08 DIAGNOSIS — G20 Parkinson's disease: Secondary | ICD-10-CM | POA: Diagnosis not present

## 2012-04-08 DIAGNOSIS — J309 Allergic rhinitis, unspecified: Secondary | ICD-10-CM | POA: Diagnosis not present

## 2012-04-08 DIAGNOSIS — R634 Abnormal weight loss: Secondary | ICD-10-CM | POA: Diagnosis not present

## 2012-04-15 DIAGNOSIS — J309 Allergic rhinitis, unspecified: Secondary | ICD-10-CM | POA: Diagnosis not present

## 2012-04-15 DIAGNOSIS — M5137 Other intervertebral disc degeneration, lumbosacral region: Secondary | ICD-10-CM | POA: Diagnosis not present

## 2012-04-22 DIAGNOSIS — M5137 Other intervertebral disc degeneration, lumbosacral region: Secondary | ICD-10-CM | POA: Diagnosis not present

## 2012-04-22 DIAGNOSIS — J309 Allergic rhinitis, unspecified: Secondary | ICD-10-CM | POA: Diagnosis not present

## 2012-04-28 DIAGNOSIS — J309 Allergic rhinitis, unspecified: Secondary | ICD-10-CM | POA: Diagnosis not present

## 2012-05-06 DIAGNOSIS — M5137 Other intervertebral disc degeneration, lumbosacral region: Secondary | ICD-10-CM | POA: Diagnosis not present

## 2012-05-06 DIAGNOSIS — J309 Allergic rhinitis, unspecified: Secondary | ICD-10-CM | POA: Diagnosis not present

## 2012-05-13 DIAGNOSIS — M47817 Spondylosis without myelopathy or radiculopathy, lumbosacral region: Secondary | ICD-10-CM | POA: Diagnosis not present

## 2012-05-13 DIAGNOSIS — J309 Allergic rhinitis, unspecified: Secondary | ICD-10-CM | POA: Diagnosis not present

## 2012-05-19 DIAGNOSIS — J309 Allergic rhinitis, unspecified: Secondary | ICD-10-CM | POA: Diagnosis not present

## 2012-05-28 DIAGNOSIS — J309 Allergic rhinitis, unspecified: Secondary | ICD-10-CM | POA: Diagnosis not present

## 2012-05-28 DIAGNOSIS — M47817 Spondylosis without myelopathy or radiculopathy, lumbosacral region: Secondary | ICD-10-CM | POA: Diagnosis not present

## 2012-06-02 DIAGNOSIS — M47817 Spondylosis without myelopathy or radiculopathy, lumbosacral region: Secondary | ICD-10-CM | POA: Diagnosis not present

## 2012-06-03 DIAGNOSIS — Z09 Encounter for follow-up examination after completed treatment for conditions other than malignant neoplasm: Secondary | ICD-10-CM | POA: Diagnosis not present

## 2012-06-03 DIAGNOSIS — J309 Allergic rhinitis, unspecified: Secondary | ICD-10-CM | POA: Diagnosis not present

## 2012-06-03 DIAGNOSIS — R928 Other abnormal and inconclusive findings on diagnostic imaging of breast: Secondary | ICD-10-CM | POA: Diagnosis not present

## 2012-06-11 DIAGNOSIS — M47817 Spondylosis without myelopathy or radiculopathy, lumbosacral region: Secondary | ICD-10-CM | POA: Diagnosis not present

## 2012-06-11 DIAGNOSIS — J309 Allergic rhinitis, unspecified: Secondary | ICD-10-CM | POA: Diagnosis not present

## 2012-06-12 DIAGNOSIS — J309 Allergic rhinitis, unspecified: Secondary | ICD-10-CM | POA: Diagnosis not present

## 2012-06-15 DIAGNOSIS — M47817 Spondylosis without myelopathy or radiculopathy, lumbosacral region: Secondary | ICD-10-CM | POA: Diagnosis not present

## 2012-06-17 DIAGNOSIS — J309 Allergic rhinitis, unspecified: Secondary | ICD-10-CM | POA: Diagnosis not present

## 2012-06-17 DIAGNOSIS — M47817 Spondylosis without myelopathy or radiculopathy, lumbosacral region: Secondary | ICD-10-CM | POA: Diagnosis not present

## 2012-06-22 DIAGNOSIS — M47817 Spondylosis without myelopathy or radiculopathy, lumbosacral region: Secondary | ICD-10-CM | POA: Diagnosis not present

## 2012-06-24 DIAGNOSIS — M47817 Spondylosis without myelopathy or radiculopathy, lumbosacral region: Secondary | ICD-10-CM | POA: Diagnosis not present

## 2012-06-24 DIAGNOSIS — J309 Allergic rhinitis, unspecified: Secondary | ICD-10-CM | POA: Diagnosis not present

## 2012-07-01 DIAGNOSIS — J309 Allergic rhinitis, unspecified: Secondary | ICD-10-CM | POA: Diagnosis not present

## 2012-07-01 DIAGNOSIS — M47817 Spondylosis without myelopathy or radiculopathy, lumbosacral region: Secondary | ICD-10-CM | POA: Diagnosis not present

## 2012-07-08 DIAGNOSIS — J309 Allergic rhinitis, unspecified: Secondary | ICD-10-CM | POA: Diagnosis not present

## 2012-07-15 DIAGNOSIS — J309 Allergic rhinitis, unspecified: Secondary | ICD-10-CM | POA: Diagnosis not present

## 2012-07-21 DIAGNOSIS — L259 Unspecified contact dermatitis, unspecified cause: Secondary | ICD-10-CM | POA: Diagnosis not present

## 2012-07-21 DIAGNOSIS — L219 Seborrheic dermatitis, unspecified: Secondary | ICD-10-CM | POA: Diagnosis not present

## 2012-07-23 DIAGNOSIS — J309 Allergic rhinitis, unspecified: Secondary | ICD-10-CM | POA: Diagnosis not present

## 2012-07-27 DIAGNOSIS — K219 Gastro-esophageal reflux disease without esophagitis: Secondary | ICD-10-CM | POA: Diagnosis not present

## 2012-07-27 DIAGNOSIS — R634 Abnormal weight loss: Secondary | ICD-10-CM | POA: Diagnosis not present

## 2012-07-30 DIAGNOSIS — J309 Allergic rhinitis, unspecified: Secondary | ICD-10-CM | POA: Diagnosis not present

## 2012-08-05 DIAGNOSIS — M169 Osteoarthritis of hip, unspecified: Secondary | ICD-10-CM | POA: Diagnosis not present

## 2012-08-05 DIAGNOSIS — J309 Allergic rhinitis, unspecified: Secondary | ICD-10-CM | POA: Diagnosis not present

## 2012-08-11 DIAGNOSIS — G20 Parkinson's disease: Secondary | ICD-10-CM | POA: Diagnosis not present

## 2012-08-11 DIAGNOSIS — R634 Abnormal weight loss: Secondary | ICD-10-CM | POA: Diagnosis not present

## 2012-08-11 DIAGNOSIS — J309 Allergic rhinitis, unspecified: Secondary | ICD-10-CM | POA: Diagnosis not present

## 2012-08-12 ENCOUNTER — Other Ambulatory Visit: Payer: Self-pay | Admitting: Gastroenterology

## 2012-08-12 DIAGNOSIS — D1330 Benign neoplasm of unspecified part of small intestine: Secondary | ICD-10-CM | POA: Diagnosis not present

## 2012-08-12 DIAGNOSIS — K229 Disease of esophagus, unspecified: Secondary | ICD-10-CM | POA: Diagnosis not present

## 2012-08-12 DIAGNOSIS — R1013 Epigastric pain: Secondary | ICD-10-CM | POA: Diagnosis not present

## 2012-08-12 DIAGNOSIS — K469 Unspecified abdominal hernia without obstruction or gangrene: Secondary | ICD-10-CM

## 2012-08-12 DIAGNOSIS — R634 Abnormal weight loss: Secondary | ICD-10-CM | POA: Diagnosis not present

## 2012-08-12 DIAGNOSIS — K449 Diaphragmatic hernia without obstruction or gangrene: Secondary | ICD-10-CM | POA: Diagnosis not present

## 2012-08-12 DIAGNOSIS — K319 Disease of stomach and duodenum, unspecified: Secondary | ICD-10-CM | POA: Diagnosis not present

## 2012-08-12 HISTORY — PX: ESOPHAGOGASTRODUODENOSCOPY: SHX1529

## 2012-08-13 DIAGNOSIS — Z23 Encounter for immunization: Secondary | ICD-10-CM | POA: Diagnosis not present

## 2012-08-17 ENCOUNTER — Ambulatory Visit
Admission: RE | Admit: 2012-08-17 | Discharge: 2012-08-17 | Disposition: A | Discharge status: Home / Self Care | Payer: Medicare Other | Source: Ambulatory Visit | Attending: Gastroenterology | Admitting: Gastroenterology

## 2012-08-17 ENCOUNTER — Other Ambulatory Visit: Payer: Self-pay | Admitting: Gastroenterology

## 2012-08-17 DIAGNOSIS — K469 Unspecified abdominal hernia without obstruction or gangrene: Secondary | ICD-10-CM

## 2012-08-17 DIAGNOSIS — K449 Diaphragmatic hernia without obstruction or gangrene: Secondary | ICD-10-CM | POA: Diagnosis not present

## 2012-08-19 DIAGNOSIS — M5137 Other intervertebral disc degeneration, lumbosacral region: Secondary | ICD-10-CM | POA: Diagnosis not present

## 2012-08-19 DIAGNOSIS — J309 Allergic rhinitis, unspecified: Secondary | ICD-10-CM | POA: Diagnosis not present

## 2012-08-20 DIAGNOSIS — M810 Age-related osteoporosis without current pathological fracture: Secondary | ICD-10-CM | POA: Diagnosis not present

## 2012-08-20 DIAGNOSIS — E7800 Pure hypercholesterolemia, unspecified: Secondary | ICD-10-CM | POA: Diagnosis not present

## 2012-08-26 DIAGNOSIS — J45909 Unspecified asthma, uncomplicated: Secondary | ICD-10-CM | POA: Diagnosis not present

## 2012-08-26 DIAGNOSIS — Z1331 Encounter for screening for depression: Secondary | ICD-10-CM | POA: Diagnosis not present

## 2012-08-26 DIAGNOSIS — J309 Allergic rhinitis, unspecified: Secondary | ICD-10-CM | POA: Diagnosis not present

## 2012-08-26 DIAGNOSIS — Z0000 Encounter for general adult medical examination without abnormal findings: Secondary | ICD-10-CM | POA: Diagnosis not present

## 2012-08-28 DIAGNOSIS — K449 Diaphragmatic hernia without obstruction or gangrene: Secondary | ICD-10-CM | POA: Diagnosis not present

## 2012-08-28 DIAGNOSIS — R634 Abnormal weight loss: Secondary | ICD-10-CM | POA: Diagnosis not present

## 2012-08-31 DIAGNOSIS — J3089 Other allergic rhinitis: Secondary | ICD-10-CM | POA: Diagnosis not present

## 2012-08-31 DIAGNOSIS — J019 Acute sinusitis, unspecified: Secondary | ICD-10-CM | POA: Diagnosis not present

## 2012-08-31 DIAGNOSIS — J45909 Unspecified asthma, uncomplicated: Secondary | ICD-10-CM | POA: Diagnosis not present

## 2012-09-02 DIAGNOSIS — M949 Disorder of cartilage, unspecified: Secondary | ICD-10-CM | POA: Diagnosis not present

## 2012-09-02 DIAGNOSIS — M899 Disorder of bone, unspecified: Secondary | ICD-10-CM | POA: Diagnosis not present

## 2012-09-14 DIAGNOSIS — J309 Allergic rhinitis, unspecified: Secondary | ICD-10-CM | POA: Diagnosis not present

## 2012-09-17 ENCOUNTER — Encounter (INDEPENDENT_AMBULATORY_CARE_PROVIDER_SITE_OTHER): Payer: Self-pay | Admitting: General Surgery

## 2012-09-23 DIAGNOSIS — J309 Allergic rhinitis, unspecified: Secondary | ICD-10-CM | POA: Diagnosis not present

## 2012-09-28 ENCOUNTER — Encounter (INDEPENDENT_AMBULATORY_CARE_PROVIDER_SITE_OTHER): Payer: Self-pay | Admitting: General Surgery

## 2012-09-30 ENCOUNTER — Encounter (INDEPENDENT_AMBULATORY_CARE_PROVIDER_SITE_OTHER): Payer: Self-pay | Admitting: Surgery

## 2012-09-30 ENCOUNTER — Ambulatory Visit (INDEPENDENT_AMBULATORY_CARE_PROVIDER_SITE_OTHER): Payer: Medicare Other | Admitting: Surgery

## 2012-09-30 VITALS — BP 122/60 | HR 84 | Temp 97.3°F | Resp 20 | Ht 65.0 in | Wt 100.2 lb

## 2012-09-30 DIAGNOSIS — K449 Diaphragmatic hernia without obstruction or gangrene: Secondary | ICD-10-CM | POA: Insufficient documentation

## 2012-09-30 DIAGNOSIS — J45909 Unspecified asthma, uncomplicated: Secondary | ICD-10-CM | POA: Insufficient documentation

## 2012-09-30 DIAGNOSIS — J309 Allergic rhinitis, unspecified: Secondary | ICD-10-CM | POA: Diagnosis not present

## 2012-09-30 DIAGNOSIS — G20 Parkinson's disease: Secondary | ICD-10-CM | POA: Diagnosis not present

## 2012-09-30 NOTE — Progress Notes (Signed)
Chief Complaint:  Large type III hiatus hernia   History of Present Illness:  Jill Hamilton is an 78 y.o. female who comes in with her husband to discuss hiatus hernia surgery.  I reviewed her x-ray and discussed the procedure in detail with her and her husband. She has some debility with Parkinson's disease but has probably had problems with reflux for many years dating back to her 6s. At that time she had adult onset asthma diagnosed. From the looks of her upper GI she had significant obstruction and this goes along with her stating of mucous and choking. She has lost 30 pounds this year and she is unable to the.  I described our goals and surgery would be to try to mobilize her stomach out of her chest and likely do a gastropexy or possibly place a G-tube. He seemed to understand this. Also indicated the risk of the surgery and the risk of mortality related to perforation. Because of her quality of life I think we should go ahead and proceed with surgery. They would like to wait until after Christmas.  Past Medical History  Diagnosis Date  . Hyperlipidemia   . Parkinson's disease   . Diabetes mellitus without complication   . Scoliosis   . Asthma   . Arthritis     Past Surgical History  Procedure Date  . Abdominal hysterectomy   . Cholecystectomy   . Carpal tunnel release     bilateral  . Hammer toe surgery     bilateral  . Esophagogastroduodenoscopy 08/12/12    Current Outpatient Prescriptions  Medication Sig Dispense Refill  . ALPRAZolam (XANAX) 0.5 MG tablet       . calcium & magnesium carbonates (MYLANTA) 311-232 MG per tablet Take 1 tablet by mouth daily.      . carbidopa-levodopa-entacapone (STALEVO) 37.5-150-200 MG per tablet Take 1 tablet by mouth 3 (three) times daily.      . citalopram (CELEXA) 40 MG tablet Take 40 mg by mouth daily.      Marland Kitchen ezetimibe (ZETIA) 10 MG tablet Take 10 mg by mouth daily.      . fish oil-omega-3 fatty acids 1000 MG capsule Take 2 g by mouth  daily.      Marland Kitchen HYDROcodone-acetaminophen (NORCO/VICODIN) 5-325 MG per tablet Take 1 tablet by mouth every 6 (six) hours as needed.      . niacin 100 MG tablet Take 100 mg by mouth daily with breakfast.      . omeprazole (PRILOSEC) 10 MG capsule Take 10 mg by mouth daily.      Marland Kitchen PREVIDENT 5000 DRY MOUTH 1.1 % PSTE       . raloxifene (EVISTA) 60 MG tablet Take 60 mg by mouth daily.      Marland Kitchen rOPINIRole (REQUIP) 0.25 MG tablet Take 0.25 mg by mouth 3 (three) times daily.      . vitamin A 69629 UNIT capsule Take 25,000 Units by mouth daily.       Iohexol Family History  Problem Relation Age of Onset  . Heart disease Father    Social History:   reports that she has never smoked. She does not have any smokeless tobacco history on file. She reports that she does not drink alcohol or use illicit drugs.   REVIEW OF SYSTEMS - PERTINENT POSITIVES ONLY: No history of DVT  Physical Exam:   Blood pressure 122/60, pulse 84, temperature 97.3 F (36.3 C), temperature source Temporal, resp. rate 20, height 5\' 5"  (1.651  m), weight 100 lb 3.2 oz (45.45 kg). Body mass index is 16.67 kg/(m^2).  Gen:  WDWN WF NAD  Neurological: Alert and oriented to person, place, and time. Motor and sensory function is grossly intact  Head: Normocephalic and atraumatic.  Eyes: Conjunctivae are normal. Pupils are equal, round, and reactive to light. No scleral icterus.  Neck: Normal range of motion. Neck supple. No tracheal deviation or thyromegaly present.  Cardiovascular:  SR without murmurs or gallops.  No carotid bruits Respiratory: Effort normal.  No respiratory distress. No chest wall tenderness. Breath sounds normal.  No wheezes, rales or rhonchi.  Abdomen:  nontender GU: Musculoskeletal: Normal range of motion. Extremities are nontender. No cyanosis, edema or clubbing noted Lymphadenopathy: No cervical, preauricular, postauricular or axillary adenopathy is present Skin: Skin is warm and dry. No rash noted. No  diaphoresis. No erythema. No pallor. Pscyh: Normal mood and affect. Behavior is normal. Judgment and thought content normal.   LABORATORY RESULTS: No results found for this or any previous visit (from the past 48 hour(s)).  RADIOLOGY RESULTS: No results found.  Problem List: There is no problem list on file for this patient.   Assessment & Plan: Large type III mixed hiatus hernia with the entire stomach in her chest.  Will proceed to schedule for lap repair of hiatus hernia.    Matt B. Daphine Deutscher, MD, [facility] [phone] beeper [phone]  09/30/2012 10:26 AM

## 2012-09-30 NOTE — Patient Instructions (Signed)
Nissen Fundoplication Care After Please read the instructions outlined below and refer to this sheet for the next few weeks. These discharge instructions provide you with general information on caring for yourself after you leave the hospital. Your doctor may also give you specific instructions. While your treatment has been planned according to the most current medical practices available, unavoidable complications sometimes happen. If you have any problems or questions after discharge, please call your doctor. ACTIVITY  Take frequent rest periods throughout the day.  Take frequent walks throughout the day. This will help to prevent blood clots.  Continue to do your coughing and deep breathing exercises once you get home. This will help to prevent pneumonia.  No strenuous activities such as heavy lifting, pushing or pulling until after your follow-up visit with your doctor. Do not lift anything heavier than 10 pounds.  Talk with your caregiver about when you may return to work and your exercise routine.  You may shower 2 days after surgery. Pat incisions dry. Do not rub incisions with washcloth or towel.  Do not drive while taking prescription pain medication. NUTRITION  Continue with a liquid diet, or the diet you were directed to take, until your first follow-up visit with your surgeon.  Drink fluids (6-8 glasses a day).  Call your caregiver for persistent nausea (feeling sick to your stomach), vomiting, bloating or difficulty swallowing. ELIMINATION It is very important not to strain during bowel movements. If constipation should occur, you may:  Take a mild laxative (such as Milk of Magnesia).  Add fruit and bran to your diet.  Drink more fluids.  Call your caregiver if constipation is not relieved. FEVER If you feel feverish or have shaking chills, take your temperature. If it is 102 F (38.9 C) or above, call your caregiver. The fever may mean there is an infection. PAIN  CONTROL  If a prescription was given for a pain reliever, please follow your caregiver's directions.  Only take over-the-counter or prescription medicines for pain, discomfort, or fever as directed by your caregiver.  If the pain is not relieved by your medicine, becomes worse, or you have difficulty breathing, call your doctor. INCISION  It is normal for your cuts (incisions) from surgery to have a small amount of drainage for the first 1-2 days. Once the drainage has stopped, leave your incision(s) open to air.  Check your incision(s) and surrounding area daily for any redness, swelling, increased drainage or bleeding. If any of these are present or if the wound edges start to separate, call your doctor.  If you have small adhesive strips in place, they will peel and fall off. (If these strips are covered with a clear bandage, your doctor will tell you when to remove them.)  If you have staples, your caregiver will remove them at the follow-up appointment. Document Released: 06/20/2004 Document Revised: 01/20/2012 Document Reviewed: 09/24/2007 Exit[employer] Patient Information 2013 [employer].  

## 2012-10-05 DIAGNOSIS — J309 Allergic rhinitis, unspecified: Secondary | ICD-10-CM | POA: Diagnosis not present

## 2012-10-12 DIAGNOSIS — J309 Allergic rhinitis, unspecified: Secondary | ICD-10-CM | POA: Diagnosis not present

## 2012-10-14 DIAGNOSIS — H2510 Age-related nuclear cataract, unspecified eye: Secondary | ICD-10-CM | POA: Diagnosis not present

## 2012-10-21 DIAGNOSIS — J309 Allergic rhinitis, unspecified: Secondary | ICD-10-CM | POA: Diagnosis not present

## 2012-10-28 DIAGNOSIS — J309 Allergic rhinitis, unspecified: Secondary | ICD-10-CM | POA: Diagnosis not present

## 2012-11-10 DIAGNOSIS — M5137 Other intervertebral disc degeneration, lumbosacral region: Secondary | ICD-10-CM | POA: Diagnosis not present

## 2012-11-10 DIAGNOSIS — J309 Allergic rhinitis, unspecified: Secondary | ICD-10-CM | POA: Diagnosis not present

## 2012-11-17 ENCOUNTER — Encounter (HOSPITAL_COMMUNITY): Payer: Self-pay | Admitting: Pharmacy Technician

## 2012-11-18 DIAGNOSIS — K2270 Barrett's esophagus without dysplasia: Secondary | ICD-10-CM | POA: Diagnosis not present

## 2012-11-18 DIAGNOSIS — J309 Allergic rhinitis, unspecified: Secondary | ICD-10-CM | POA: Diagnosis not present

## 2012-11-18 DIAGNOSIS — M810 Age-related osteoporosis without current pathological fracture: Secondary | ICD-10-CM | POA: Diagnosis not present

## 2012-11-19 ENCOUNTER — Telehealth (INDEPENDENT_AMBULATORY_CARE_PROVIDER_SITE_OTHER): Payer: Self-pay

## 2012-11-19 NOTE — Telephone Encounter (Signed)
Message copied by Ronney Lion on Thu Nov 19, 2012 12:42 PM ------      Message from: Docia Chuck      Created: Mon Nov 16, 2012  5:39 PM      Regarding: Please call this pt       She is scheduled for surgery on 11/27/12 with Dr. Daphine Deutscher and has questions about her medications.  I told her you would call her.            Thanks

## 2012-11-19 NOTE — Telephone Encounter (Signed)
Returned pt's call regarding her questions about medications prior to Sx.  The pt stated that our office has already answered all of the questions she had, though she could not remember who she spoke with (it is not documented anywhere in her chart)

## 2012-11-20 ENCOUNTER — Other Ambulatory Visit (HOSPITAL_COMMUNITY): Payer: Self-pay | Admitting: *Deleted

## 2012-11-23 ENCOUNTER — Encounter (HOSPITAL_COMMUNITY)
Admission: RE | Admit: 2012-11-23 | Discharge: 2012-11-23 | Disposition: A | Discharge status: Home / Self Care | Payer: Medicare Other | Source: Ambulatory Visit | Attending: Surgery | Admitting: Surgery

## 2012-11-23 ENCOUNTER — Encounter (HOSPITAL_COMMUNITY): Payer: Self-pay

## 2012-11-23 DIAGNOSIS — Z79899 Other long term (current) drug therapy: Secondary | ICD-10-CM | POA: Insufficient documentation

## 2012-11-23 DIAGNOSIS — J45909 Unspecified asthma, uncomplicated: Secondary | ICD-10-CM | POA: Insufficient documentation

## 2012-11-23 DIAGNOSIS — J309 Allergic rhinitis, unspecified: Secondary | ICD-10-CM | POA: Diagnosis not present

## 2012-11-23 DIAGNOSIS — K449 Diaphragmatic hernia without obstruction or gangrene: Secondary | ICD-10-CM | POA: Insufficient documentation

## 2012-11-23 DIAGNOSIS — G20A1 Parkinson's disease without dyskinesia, without mention of fluctuations: Secondary | ICD-10-CM | POA: Insufficient documentation

## 2012-11-23 DIAGNOSIS — Z01812 Encounter for preprocedural laboratory examination: Secondary | ICD-10-CM | POA: Diagnosis not present

## 2012-11-23 DIAGNOSIS — E782 Mixed hyperlipidemia: Secondary | ICD-10-CM | POA: Insufficient documentation

## 2012-11-23 DIAGNOSIS — K219 Gastro-esophageal reflux disease without esophagitis: Secondary | ICD-10-CM | POA: Insufficient documentation

## 2012-11-23 DIAGNOSIS — G20 Parkinson's disease: Secondary | ICD-10-CM | POA: Insufficient documentation

## 2012-11-23 HISTORY — DX: Anxiety disorder, unspecified: F41.9

## 2012-11-23 LAB — CBC
HCT: 41.1 % (ref 36.0–46.0)
Hemoglobin: 13.1 g/dL (ref 12.0–15.0)
MCH: 30.6 pg (ref 26.0–34.0)
MCV: 96 fL (ref 78.0–100.0)
RBC: 4.28 MIL/uL (ref 3.87–5.11)

## 2012-11-23 LAB — BASIC METABOLIC PANEL
CO2: 27 mEq/L (ref 19–32)
Calcium: 8.7 mg/dL (ref 8.4–10.5)
Chloride: 107 mEq/L (ref 96–112)
Glucose, Bld: 100 mg/dL — ABNORMAL HIGH (ref 70–99)
Potassium: 5.1 mEq/L (ref 3.5–5.1)
Sodium: 140 mEq/L (ref 135–145)

## 2012-11-23 MED ORDER — FLEET ENEMA 7-19 GM/118ML RE ENEM
1.0000 | ENEMA | Freq: Once | RECTAL | Status: DC
  Filled 2012-11-23: qty 1

## 2012-11-23 NOTE — Patient Instructions (Signed)
20      Your procedure is scheduled on:  Friday 11/27/2012  Report to [facility] at  0530 AM.  Call this number if you have problems the morning of surgery: [phone]   Remember: USE FLEET'S ENEMA NIGHT BEFORE SURGERY!             IF YOU USE CPAP,BRING MASK AND TUBING AM OF SURGERY!   Do not eat food or drink liquids AFTER MIDNIGHT!  Take these medicines the morning of surgery with A SIP OF WATER: Citalopram,Omeprazole,carbidopa-levadopa-entacapone, may use Albuterol inhaler if needed   Do not bring valuables to the hospital.  .  Leave suitcase in the car. After surgery it may be brought to your room.  For patients admitted to the hospital, checkout time is 11:00 AM the day of              Discharge.    DO NOT WEAR JEWELRY , MAKE-UP, LOTIONS,POWDERS,PERFUMES!             WOMEN -DO NOT SHAVE LEGS OR UNDERARMS 12 HRS. BEFORE SURGERY!               MEN MAY SHAVE AS USUAL!             CONTACTS,DENTURES OR BRIDGEWORK, FALSE EYELASHES MAY NOT BE WORN INTO SURGERY!                                                           Patients discharged the day of surgery will not be allowed to drive home.If going home the same day of surgery, must have someone stay with youfirst 24 hrs.at home and arrange for someone to drive you home from the              Hospital.              YOUR DRIVER ZO:[id number]   Special Instructions:             Please read over the following fact sheets that you were given:             1. [facility] PREPARING FOR SURGERY SHEET              2.[facility] INFORMATION              3.[facility]                                                  Telford Nab.Giselle Brutus,RN,BSN     [phone]                FAILURE TO FOLLOW THESE INSTRUCTIONS MAY RESULT IN CANCELLATION OF YOUR SURGERY!               Patient Signature:___________________________

## 2012-11-25 ENCOUNTER — Telehealth (INDEPENDENT_AMBULATORY_CARE_PROVIDER_SITE_OTHER): Payer: Self-pay

## 2012-11-25 NOTE — Telephone Encounter (Signed)
The pt is on Omeprazole and she said Dr Daphine Deutscher asked her to take it twice a day back in November.  She now feels like it may be the cause of bilateral ankle swelling.  She also feels like her bladder won't empty completely.  She has to go frequently.  I asked Dr Daphine Deutscher and he didn't know.  I told the pt to go get checked by her medical dr because her surgery is Friday.

## 2012-11-27 NOTE — Progress Notes (Signed)
Cbc, bmet, surgical screen done 11-23-12 in epic

## 2012-12-01 DIAGNOSIS — J309 Allergic rhinitis, unspecified: Secondary | ICD-10-CM | POA: Diagnosis not present

## 2012-12-07 DIAGNOSIS — J309 Allergic rhinitis, unspecified: Secondary | ICD-10-CM | POA: Diagnosis not present

## 2012-12-09 DIAGNOSIS — J309 Allergic rhinitis, unspecified: Secondary | ICD-10-CM | POA: Diagnosis not present

## 2012-12-14 ENCOUNTER — Encounter (HOSPITAL_COMMUNITY): Payer: Self-pay | Admitting: *Deleted

## 2012-12-14 DIAGNOSIS — G20 Parkinson's disease: Secondary | ICD-10-CM | POA: Diagnosis not present

## 2012-12-14 DIAGNOSIS — J309 Allergic rhinitis, unspecified: Secondary | ICD-10-CM | POA: Diagnosis not present

## 2012-12-14 NOTE — Anesthesia Preprocedure Evaluation (Addendum)
Anesthesia Evaluation  Patient identified by MRN, date of birth, ID band Patient awake    Reviewed: Allergy & Precautions, H&P , NPO status , Patient's Chart, lab work & pertinent test results  History of Anesthesia Complications (+) DIFFICULT AIRWAY  Airway Mallampati: II TM Distance: <3 FB Neck ROM: full    Dental No notable dental hx. (+) Caps,    Pulmonary asthma ,  Allergic type asthma breath sounds clear to auscultation  Pulmonary exam normal       Cardiovascular Exercise Tolerance: Good negative cardio ROS  Rhythm:regular Rate:Normal     Neuro/Psych Anxiety Parkinson's disease  Neuromuscular disease negative psych ROS   GI/Hepatic negative GI ROS, Neg liver ROS, hiatal hernia, GERD-  Medicated and Controlled,  Endo/Other    Renal/GU negative Renal ROS  negative genitourinary   Musculoskeletal   Abdominal   Peds  Hematology negative hematology ROS (+)   Anesthesia Other Findings   Reproductive/Obstetrics negative OB ROS                          Anesthesia Physical Anesthesia Plan  ASA: III  Anesthesia Plan: General   Post-op Pain Management:    Induction: Intravenous  Airway Management Planned: Oral ETT  Additional Equipment:   Intra-op Plan:   Post-operative Plan: Extubation in OR  Informed Consent: I have reviewed the patients History and Physical, chart, labs and discussed the procedure including the risks, benefits and alternatives for the proposed anesthesia with the patient or authorized representative who has indicated his/her understanding and acceptance.   Dental Advisory Given  Plan Discussed with: CRNA and Surgeon  Anesthesia Plan Comments:         Anesthesia Quick Evaluation

## 2012-12-15 ENCOUNTER — Encounter (HOSPITAL_COMMUNITY)
Admission: RE | Disposition: A | Discharge status: Home / Self Care | Payer: Self-pay | Source: Ambulatory Visit | Attending: Surgery

## 2012-12-15 ENCOUNTER — Inpatient Hospital Stay (HOSPITAL_COMMUNITY): Payer: Medicare Other | Admitting: Anesthesiology

## 2012-12-15 ENCOUNTER — Encounter (HOSPITAL_COMMUNITY): Payer: Self-pay | Admitting: *Deleted

## 2012-12-15 ENCOUNTER — Encounter (HOSPITAL_COMMUNITY): Payer: Self-pay | Admitting: Anesthesiology

## 2012-12-15 ENCOUNTER — Inpatient Hospital Stay (HOSPITAL_COMMUNITY)
Admission: RE | Admit: 2012-12-15 | Discharge: 2012-12-19 | DRG: 328 | Disposition: A | Discharge status: Home / Self Care | Payer: Medicare Other | Source: Ambulatory Visit | Attending: Surgery | Admitting: Surgery

## 2012-12-15 DIAGNOSIS — J45909 Unspecified asthma, uncomplicated: Secondary | ICD-10-CM | POA: Diagnosis present

## 2012-12-15 DIAGNOSIS — E1169 Type 2 diabetes mellitus with other specified complication: Secondary | ICD-10-CM | POA: Diagnosis present

## 2012-12-15 DIAGNOSIS — E785 Hyperlipidemia, unspecified: Secondary | ICD-10-CM | POA: Diagnosis not present

## 2012-12-15 DIAGNOSIS — Z79899 Other long term (current) drug therapy: Secondary | ICD-10-CM

## 2012-12-15 DIAGNOSIS — G20 Parkinson's disease: Secondary | ICD-10-CM | POA: Diagnosis not present

## 2012-12-15 DIAGNOSIS — G20A1 Parkinson's disease without dyskinesia, without mention of fluctuations: Secondary | ICD-10-CM | POA: Diagnosis not present

## 2012-12-15 DIAGNOSIS — K449 Diaphragmatic hernia without obstruction or gangrene: Secondary | ICD-10-CM | POA: Diagnosis not present

## 2012-12-15 HISTORY — PX: HIATAL HERNIA REPAIR: SHX195

## 2012-12-15 LAB — CBC
Hemoglobin: 11.7 g/dL — ABNORMAL LOW (ref 12.0–15.0)
RBC: 3.79 MIL/uL — ABNORMAL LOW (ref 3.87–5.11)

## 2012-12-15 LAB — HEMOGLOBIN A1C
Hgb A1c MFr Bld: 6 % — ABNORMAL HIGH (ref <5.7)
Mean Plasma Glucose: 126 mg/dL — ABNORMAL HIGH (ref <117)

## 2012-12-15 LAB — CREATININE, SERUM
Creatinine, Ser: 0.6 mg/dL (ref 0.50–1.10)
GFR calc non Af Amer: 86 mL/min — ABNORMAL LOW (ref >90)

## 2012-12-15 SURGERY — REPAIR, HERNIA, HIATAL, LAPAROSCOPIC
Anesthesia: General | Site: Abdomen | Wound class: Clean Contaminated

## 2012-12-15 MED ORDER — MORPHINE SULFATE 2 MG/ML IJ SOLN
1.0000 mg | INTRAMUSCULAR | Status: DC | PRN
  Administered 2012-12-15 (×3): 1 mg via INTRAVENOUS
  Filled 2012-12-15 (×2): qty 1

## 2012-12-15 MED ORDER — ONDANSETRON HCL 4 MG/2ML IJ SOLN
INTRAMUSCULAR | Status: DC | PRN
  Administered 2012-12-15: 4 mg via INTRAVENOUS

## 2012-12-15 MED ORDER — PROPOFOL 10 MG/ML IV BOLUS
INTRAVENOUS | Status: DC | PRN
  Administered 2012-12-15: 120 mg via INTRAVENOUS

## 2012-12-15 MED ORDER — NEOSTIGMINE METHYLSULFATE 1 MG/ML IJ SOLN
INTRAMUSCULAR | Status: DC | PRN
  Administered 2012-12-15: 4 mg via INTRAVENOUS

## 2012-12-15 MED ORDER — ROCURONIUM BROMIDE 100 MG/10ML IV SOLN
INTRAVENOUS | Status: DC | PRN
  Administered 2012-12-15: 10 mg via INTRAVENOUS
  Administered 2012-12-15: 30 mg via INTRAVENOUS

## 2012-12-15 MED ORDER — SUCCINYLCHOLINE CHLORIDE 20 MG/ML IJ SOLN
INTRAMUSCULAR | Status: DC | PRN
  Administered 2012-12-15: 100 mg via INTRAVENOUS

## 2012-12-15 MED ORDER — CHLORHEXIDINE GLUCONATE 0.12 % MT SOLN
15.0000 mL | Freq: Two times a day (BID) | OROMUCOSAL | Status: DC
  Administered 2012-12-16 – 2012-12-19 (×6): 15 mL via OROMUCOSAL
  Filled 2012-12-15 (×10): qty 15

## 2012-12-15 MED ORDER — LACTATED RINGERS IR SOLN
Status: DC | PRN
  Administered 2012-12-15: 3000 mL

## 2012-12-15 MED ORDER — HEPARIN SODIUM (PORCINE) 5000 UNIT/ML IJ SOLN
5000.0000 [IU] | Freq: Once | INTRAMUSCULAR | Status: AC
  Administered 2012-12-15: 5000 [IU] via SUBCUTANEOUS
  Filled 2012-12-15: qty 1

## 2012-12-15 MED ORDER — LACTATED RINGERS IV SOLN: INTRAVENOUS | Status: DC

## 2012-12-15 MED ORDER — ONDANSETRON HCL 4 MG/2ML IJ SOLN: 4.0000 mg | Freq: Four times a day (QID) | INTRAMUSCULAR | Status: DC | PRN

## 2012-12-15 MED ORDER — INSULIN ASPART 100 UNIT/ML ~~LOC~~ SOLN
0.0000 [IU] | SUBCUTANEOUS | Status: DC
  Administered 2012-12-16: 1 [IU] via SUBCUTANEOUS
  Administered 2012-12-17: 2 [IU] via SUBCUTANEOUS
  Administered 2012-12-18: 1 [IU] via SUBCUTANEOUS

## 2012-12-15 MED ORDER — ALBUTEROL SULFATE HFA 108 (90 BASE) MCG/ACT IN AERS
2.0000 | INHALATION_SPRAY | Freq: Four times a day (QID) | RESPIRATORY_TRACT | Status: DC | PRN
  Filled 2012-12-15: qty 6.7

## 2012-12-15 MED ORDER — BIOTENE DRY MOUTH MT LIQD
15.0000 mL | Freq: Two times a day (BID) | OROMUCOSAL | Status: DC
  Administered 2012-12-16 – 2012-12-18 (×6): 15 mL via OROMUCOSAL

## 2012-12-15 MED ORDER — MORPHINE SULFATE 2 MG/ML IJ SOLN
INTRAMUSCULAR | Status: AC
  Filled 2012-12-15: qty 1

## 2012-12-15 MED ORDER — ACETAMINOPHEN 10 MG/ML IV SOLN
INTRAVENOUS | Status: DC | PRN
  Administered 2012-12-15: 1000 mg via INTRAVENOUS

## 2012-12-15 MED ORDER — HYDROMORPHONE HCL PF 1 MG/ML IJ SOLN
0.2500 mg | INTRAMUSCULAR | Status: DC | PRN
  Administered 2012-12-15 (×2): 0.5 mg via INTRAVENOUS
  Administered 2012-12-15 (×2): 0.25 mg via INTRAVENOUS

## 2012-12-15 MED ORDER — ONDANSETRON HCL 4 MG PO TABS: 4.0000 mg | ORAL_TABLET | Freq: Four times a day (QID) | ORAL | Status: DC | PRN

## 2012-12-15 MED ORDER — GLYCOPYRROLATE 0.2 MG/ML IJ SOLN
INTRAMUSCULAR | Status: DC | PRN
  Administered 2012-12-15: 0.6 mg via INTRAVENOUS

## 2012-12-15 MED ORDER — DEXTROSE 5 % IV SOLN
2.0000 g | INTRAVENOUS | Status: AC
  Administered 2012-12-15: 2 g via INTRAVENOUS

## 2012-12-15 MED ORDER — BUPIVACAINE LIPOSOME 1.3 % IJ SUSP
20.0000 mL | Freq: Once | INTRAMUSCULAR | Status: AC
  Administered 2012-12-15: 20 mL
  Filled 2012-12-15: qty 20

## 2012-12-15 MED ORDER — MIDAZOLAM HCL 5 MG/5ML IJ SOLN
INTRAMUSCULAR | Status: DC | PRN
  Administered 2012-12-15 (×2): 1 mg via INTRAVENOUS

## 2012-12-15 MED ORDER — HEPARIN SODIUM (PORCINE) 5000 UNIT/ML IJ SOLN
5000.0000 [IU] | Freq: Three times a day (TID) | INTRAMUSCULAR | Status: DC
  Administered 2012-12-15 – 2012-12-19 (×11): 5000 [IU] via SUBCUTANEOUS
  Filled 2012-12-15 (×14): qty 1

## 2012-12-15 MED ORDER — FENTANYL CITRATE 0.05 MG/ML IJ SOLN
INTRAMUSCULAR | Status: DC | PRN
  Administered 2012-12-15 (×5): 50 ug via INTRAVENOUS

## 2012-12-15 MED ORDER — 0.9 % SODIUM CHLORIDE (POUR BTL) OPTIME
TOPICAL | Status: DC | PRN
  Administered 2012-12-15: 1000 mL

## 2012-12-15 MED ORDER — OXYCODONE-ACETAMINOPHEN 5-325 MG PO TABS
1.0000 | ORAL_TABLET | ORAL | Status: DC | PRN
  Administered 2012-12-17: 2 via ORAL
  Administered 2012-12-17 – 2012-12-18 (×3): 1 via ORAL
  Filled 2012-12-15 (×2): qty 1
  Filled 2012-12-15: qty 2
  Filled 2012-12-15: qty 1

## 2012-12-15 MED ORDER — POTASSIUM CHLORIDE IN NACL 20-0.45 MEQ/L-% IV SOLN
INTRAVENOUS | Status: DC
  Administered 2012-12-15: 14:00:00 via INTRAVENOUS
  Administered 2012-12-16: 100 mL/h via INTRAVENOUS
  Administered 2012-12-16: 11:00:00 via INTRAVENOUS
  Administered 2012-12-16: 100 mL/h via INTRAVENOUS
  Administered 2012-12-17 – 2012-12-19 (×4): via INTRAVENOUS
  Filled 2012-12-15 (×8): qty 1000

## 2012-12-15 MED ORDER — LACTATED RINGERS IV SOLN
INTRAVENOUS | Status: DC | PRN
  Administered 2012-12-15 (×4): via INTRAVENOUS

## 2012-12-15 MED ORDER — LIDOCAINE HCL (CARDIAC) 20 MG/ML IV SOLN
INTRAVENOUS | Status: DC | PRN
  Administered 2012-12-15: 50 mg via INTRAVENOUS

## 2012-12-15 SURGICAL SUPPLY — 68 items
APL SKNCLS STERI-STRIP NONHPOA (GAUZE/BANDAGES/DRESSINGS)
APPLIER CLIP 5 13 M/L LIGAMAX5 (MISCELLANEOUS) ×4
APPLIER CLIP ROT 10 11.4 M/L (STAPLE)
APR CLP MED LRG 11.4X10 (STAPLE)
APR CLP MED LRG 5 ANG JAW (MISCELLANEOUS) ×3
BENZOIN TINCTURE PRP APPL 2/3 (GAUZE/BANDAGES/DRESSINGS) ×2 IMPLANT
CABLE HIGH FREQUENCY MONO STRZ (ELECTRODE) ×2 IMPLANT
CANISTER SUCTION 2500CC (MISCELLANEOUS) IMPLANT
CLAMP ENDO BABCK 10MM (STAPLE) IMPLANT
CLIP APPLIE 5 13 M/L LIGAMAX5 (MISCELLANEOUS) ×1 IMPLANT
CLIP APPLIE ROT 10 11.4 M/L (STAPLE) IMPLANT
CLOTH BEACON ORANGE TIMEOUT ST (SAFETY) ×4 IMPLANT
COVER SURGICAL LIGHT HANDLE (MISCELLANEOUS) ×2 IMPLANT
DECANTER SPIKE VIAL GLASS SM (MISCELLANEOUS) ×4 IMPLANT
DEVICE SUT QUICK LOAD TK 5 (STAPLE) ×8 IMPLANT
DEVICE SUT TI-KNOT TK 5X26 (MISCELLANEOUS) ×6 IMPLANT
DEVICE SUTURE ENDOST 10MM (ENDOMECHANICALS) ×4 IMPLANT
DEVICE TROCAR PUNCTURE CLOSURE (ENDOMECHANICALS) ×2 IMPLANT
DISSECTOR BLUNT TIP ENDO 5MM (MISCELLANEOUS) ×4 IMPLANT
DRAIN PENROSE 18X1/2 LTX STRL (DRAIN) ×4 IMPLANT
DRAPE LAPAROSCOPIC ABDOMINAL (DRAPES) ×4 IMPLANT
ELECT REM PT RETURN 9FT ADLT (ELECTROSURGICAL) ×4
ELECTRODE REM PT RTRN 9FT ADLT (ELECTROSURGICAL) ×3 IMPLANT
ENDOSTITCH 0 SINGLE 48 (SUTURE) ×4 IMPLANT
FILTER SMOKE EVAC LAPAROSHD (FILTER) IMPLANT
GLOVE BIOGEL M 8.0 STRL (GLOVE) ×4 IMPLANT
GLOVE BIOGEL PI IND STRL 7.0 (GLOVE) ×1 IMPLANT
GLOVE BIOGEL PI INDICATOR 7.0 (GLOVE) ×1
GOWN STRL NON-REIN LRG LVL3 (GOWN DISPOSABLE) ×2 IMPLANT
GOWN STRL REIN XL XLG (GOWN DISPOSABLE) ×12 IMPLANT
GRASPER ENDO BABCOCK 10 (MISCELLANEOUS) IMPLANT
GRASPER ENDO BABCOCK 10MM (MISCELLANEOUS)
HAND ACTIVATED (MISCELLANEOUS) ×4 IMPLANT
KIT BASIN OR (CUSTOM PROCEDURE TRAY) ×4 IMPLANT
NS IRRIG 1000ML POUR BTL (IV SOLUTION) ×4 IMPLANT
PENCIL BUTTON HOLSTER BLD 10FT (ELECTRODE) IMPLANT
PLUG CATH AND CAP STER (CATHETERS) ×2 IMPLANT
SCISSORS LAP 5X35 DISP (ENDOMECHANICALS) ×4 IMPLANT
SET IRRIG TUBING LAPAROSCOPIC (IRRIGATION / IRRIGATOR) ×4 IMPLANT
SLEEVE ADV FIXATION 5X100MM (TROCAR) IMPLANT
SOLUTION ANTI FOG 6CC (MISCELLANEOUS) ×4 IMPLANT
SPONGE GAUZE 4X4 12PLY (GAUZE/BANDAGES/DRESSINGS) ×2 IMPLANT
STAPLER VISISTAT 35W (STAPLE) ×4 IMPLANT
STRIP CLOSURE SKIN 1/2X4 (GAUZE/BANDAGES/DRESSINGS) IMPLANT
SUT ETHILON 2 0 PS N (SUTURE) ×4 IMPLANT
SUT SURGIDAC NAB ES-9 0 48 120 (SUTURE) ×16 IMPLANT
SUT VIC AB 2-0 SH 27 (SUTURE) ×16
SUT VIC AB 2-0 SH 27X BRD (SUTURE) ×4 IMPLANT
SUT VIC AB 4-0 SH 18 (SUTURE) ×4 IMPLANT
SYR 30ML LL (SYRINGE) ×2 IMPLANT
TAPE CLOTH SOFT 2X10 (GAUZE/BANDAGES/DRESSINGS) ×2 IMPLANT
TIP INNERVISION DETACH 40FR (MISCELLANEOUS) IMPLANT
TIP INNERVISION DETACH 50FR (MISCELLANEOUS) IMPLANT
TIP INNERVISION DETACH 56FR (MISCELLANEOUS) IMPLANT
TIPS INNERVISION DETACH 40FR (MISCELLANEOUS)
TRAY FOLEY CATH 14FRSI W/METER (CATHETERS) ×4 IMPLANT
TRAY LAP CHOLE (CUSTOM PROCEDURE TRAY) ×4 IMPLANT
TROCAR ADV FIXATION 11X100MM (TROCAR) ×2 IMPLANT
TROCAR ADV FIXATION 5X100MM (TROCAR) IMPLANT
TROCAR BLADELESS OPT 5 75 (ENDOMECHANICALS) ×2 IMPLANT
TROCAR SLEEVE XCEL 5X75 (ENDOMECHANICALS) ×4 IMPLANT
TROCAR XCEL BLUNT TIP 100MML (ENDOMECHANICALS) IMPLANT
TROCAR XCEL NON-BLD 11X100MML (ENDOMECHANICALS) IMPLANT
TROCAR Z-THREAD FIOS 11X100 BL (TROCAR) ×2 IMPLANT
TROCAR Z-THREAD FIOS 5X100MM (TROCAR) ×2 IMPLANT
TROCAR Z-THREAD SLEEVE 11X100 (TROCAR) IMPLANT
TUBE MOSS GAS 18FR (TUBING) ×2 IMPLANT
TUBING FILTER THERMOFLATOR (ELECTROSURGICAL) ×4 IMPLANT

## 2012-12-15 NOTE — Interval H&P Note (Signed)
History and Physical Interval Note:  12/15/2012 7:31 AM  Jill Hamilton  has presented today for surgery, with the diagnosis of type III hiatal hernia  The various methods of treatment have been discussed with the patient and family. After consideration of risks, benefits and other options for treatment, the patient has consented to  Procedure(s) (LRB) with comments: LAPAROSCOPIC NISSEN FUNDOPLICATION (N/A) - Laparoscopic Repair Giant Hiatal Hernia/Nissen as a surgical intervention .  The patient's history has been reviewed, patient examined, no change in status, stable for surgery.  I have reviewed the patient's chart and labs.  Questions were answered to the patient's satisfaction.     Terrance Lanahan B

## 2012-12-15 NOTE — Op Note (Signed)
NAMEILONA, COLLEY NO.:  [id number]  MEDICAL RECORD NO.:  [id number]  LOCATION:  [id number]                         FACILITY:  [facility]  PHYSICIAN:  Thornton Park. Daphine Deutscher, MD  DATE OF BIRTH:  Nov 13, 1933  DATE OF PROCEDURE:  12/15/2012 DATE OF DISCHARGE:                              OPERATIVE REPORT   PREOPERATIVE DIAGNOSIS:  Large type 3 hiatal hernia with entire stomach and her chest.  POSTOPERATIVE DIAGNOSIS:  Large type 3 hiatal hernia with entire stomach and her chest status post takedown of large type 3 mixed hiatal hernia with free takedown of the hiatal hernia, closure of the hiatal hernia posteriorly with fixation of the stomach in the abdomen using a gastropexy and tube gastrostomy.  SURGEON:  Thornton Park. Daphine Deutscher, MD  ASSISTANT:  Sandria Bales. Ezzard Standing, M.D.  ANESTHESIA:  General endotracheal.  DESCRIPTION OF PROCEDURE:  This is a 79 year old lady who has had reflux and heartburn since her 63s presented with increasing problems with obstruction and chest tightness from hiatal hernia.  She was taken to room 11 and given general anesthesia.  The abdomen was prepped with PCMX and draped sterilely.  Time-out was performed.  Access to the abdomen was achieved through the left upper quadrant using a 5 mm 0-degree Optiview without difficulty.  Following insufflation, I took down some adhesions to the anterior abdominal wall, and then put the liver tractor beneath the left lateral segment.  Using a Harmonic scalpel, I then began taking down the sac, which was way extensive up in her chest.  At the right crus, I took this down, left crus went up higher, took down all the short gastrics freeing this completely.  I was able to place a Penrose drain behind the stomach and then was able to mobilize the stomach, was not under any tension.  I then closed and approximated the hiatus posteriorly with 4 sutures using tie knots, but approximating the crura, but certainly anteriorly  there was still a large gap that I was not able to close.  I went ahead and elected to bring the stomach down further and do a gastropexy.  I used a Moss feeding tube and inserted through a hole.  This was secured with a pursestring suture of 2-0 Vicryl securing it and then it was sutured to the anterior abdominal wall with 4 such 2-0 Vicryls tacked using an Endo Close through small sites around the exit site of the tube.  The tube itself was then held in place with the balloon, which is a 20 mL saline balloon holding it up to the anterior abdominal wall, and then it was fixed to the skin with nylon.  Port sites were all injected with Marcaine and Exparel and the abdomen was deflated and closed with 4-0 Vicryl and Dermabond.  The patient tolerated the procedure well, was taken to recovery room in satisfactory condition.    Thornton Park Daphine Deutscher, MD    MBM/MEDQ  D:  12/15/2012  T:  12/15/2012  Job:  [id number]

## 2012-12-15 NOTE — Transfer of Care (Signed)
Immediate Anesthesia Transfer of Care Note  Patient: Jill Hamilton  Procedure(s) Performed: Procedure(s) (LRB) with comments: LAPAROSCOPIC REPAIR OF HIATAL HERNIA () GASTROSTOMY TUBE (N/A) - gastropexy  Patient Location: PACU  Anesthesia Type:General  Level of Consciousness: awake, alert  and oriented  Airway & Oxygen Therapy: Patient Spontanous Breathing and Patient connected to face mask oxygen  Post-op Assessment: Report given to PACU RN and Post -op Vital signs reviewed and stable  Post vital signs: Reviewed and stable  Complications: No apparent anesthesia complications

## 2012-12-15 NOTE — H&P (Signed)
Chief Complaint: Large type III hiatus hernia  History of Present Illness: Jill Hamilton is an 79 y.o. female who comes in with her husband to discuss hiatus hernia surgery. I reviewed her x-ray and discussed the procedure in detail with her and her husband. She has some debility with Parkinson's disease but has probably had problems with reflux for many years dating back to her 44s. At that time she had adult onset asthma diagnosed. From the looks of her upper GI she had significant obstruction and this goes along with her stating of mucous and choking. She has lost 30 pounds this year and she is unable to the.  I described our goals and surgery would be to try to mobilize her stomach out of her chest and likely do a gastropexy or possibly place a G-tube. He seemed to understand this. Also indicated the risk of the surgery and the risk of mortality related to perforation. Because of her quality of life I think we should go ahead and proceed with surgery. They would like to wait until after Christmas.  Past Medical History   Diagnosis  Date   .  Hyperlipidemia    .  Parkinson's disease    .  Diabetes mellitus without complication    .  Scoliosis    .  Asthma    .  Arthritis     Past Surgical History   Procedure  Date   .  Abdominal hysterectomy    .  Cholecystectomy    .  Carpal tunnel release      bilateral   .  Hammer toe surgery      bilateral   .  Esophagogastroduodenoscopy  08/12/12    Current Outpatient Prescriptions   Medication  Sig  Dispense  Refill   .  ALPRAZolam (XANAX) 0.5 MG tablet      .  calcium & magnesium carbonates (MYLANTA) 311-232 MG per tablet  Take 1 tablet by mouth daily.     .  carbidopa-levodopa-entacapone (STALEVO) 37.5-150-200 MG per tablet  Take 1 tablet by mouth 3 (three) times daily.     .  citalopram (CELEXA) 40 MG tablet  Take 40 mg by mouth daily.     Marland Kitchen  ezetimibe (ZETIA) 10 MG tablet  Take 10 mg by mouth daily.     .  fish oil-omega-3 fatty acids 1000  MG capsule  Take 2 g by mouth daily.     Marland Kitchen  HYDROcodone-acetaminophen (NORCO/VICODIN) 5-325 MG per tablet  Take 1 tablet by mouth every 6 (six) hours as needed.     .  niacin 100 MG tablet  Take 100 mg by mouth daily with breakfast.     .  omeprazole (PRILOSEC) 10 MG capsule  Take 10 mg by mouth daily.     Marland Kitchen  PREVIDENT 5000 DRY MOUTH 1.1 % PSTE      .  raloxifene (EVISTA) 60 MG tablet  Take 60 mg by mouth daily.     Marland Kitchen  rOPINIRole (REQUIP) 0.25 MG tablet  Take 0.25 mg by mouth 3 (three) times daily.     .  vitamin A 16109 UNIT capsule  Take 25,000 Units by mouth daily.      Iohexol  Family History   Problem  Relation  Age of Onset   .  Heart disease  Father     Social History: reports that she has never smoked. She does not have any smokeless tobacco history on file. She reports that  she does not drink alcohol or use illicit drugs.  REVIEW OF SYSTEMS - PERTINENT POSITIVES ONLY:  No history of DVT  Physical Exam:  Blood pressure 122/60, pulse 84, temperature 97.3 F (36.3 C), temperature source Temporal, resp. rate 20, height 5\' 5"  (1.651 m), weight 100 lb 3.2 oz (45.45 kg).  Body mass index is 16.67 kg/(m^2).  Gen: WDWN WF NAD  Neurological: Alert and oriented to person, place, and time. Motor and sensory function is grossly intact  Head: Normocephalic and atraumatic.  Eyes: Conjunctivae are normal. Pupils are equal, round, and reactive to light. No scleral icterus.  Neck: Normal range of motion. Neck supple. No tracheal deviation or thyromegaly present.  Cardiovascular: SR without murmurs or gallops. No carotid bruits  Respiratory: Effort normal. No respiratory distress. No chest wall tenderness. Breath sounds normal. No wheezes, rales or rhonchi.  Abdomen: nontender  GU:  Musculoskeletal: Normal range of motion. Extremities are nontender. No cyanosis, edema or clubbing noted Lymphadenopathy: No cervical, preauricular, postauricular or axillary adenopathy is present Skin: Skin is warm  and dry. No rash noted. No diaphoresis. No erythema. No pallor. Pscyh: Normal mood and affect. Behavior is normal. Judgment and thought content normal.  LABORATORY RESULTS:  No results found for this or any previous visit (from the past 48 hour(s)).  RADIOLOGY RESULTS:  No results found.  Problem List:  There is no problem list on file for this patient.   Assessment & Plan:  Large type III mixed hiatus hernia with the entire stomach in her chest. Will proceed to schedule for lap repair of hiatus hernia.  Matt B. Daphine Deutscher, MD, [facility]  [phone] beeper  [phone]

## 2012-12-15 NOTE — Brief Op Note (Signed)
12/15/2012  11:29 AM  PATIENT:  Jill Hamilton  79 y.o. female  PRE-OPERATIVE DIAGNOSIS:  type III hiatal hernia  POST-OPERATIVE DIAGNOSIS:  type 111 hiatal hernia  PROCEDURE:  Procedure(s) (LRB) with comments: LAPAROSCOPIC REPAIR OF HIATAL HERNIA () GASTROSTOMY TUBE (N/A) - gastropexy  SURGEON:  Surgeon(s) and Role:    * Valarie Merino, MD - Primary    * Kandis Cocking, MD - Assisting  PHYSICIAN ASSISTANT:   ASSISTANTS: Ovidio Kin, MD, FACS   ANESTHESIA:   general  EBL:  Total I/O In: 3300 [I.V.:3300] Out: 100 [Urine:100]  BLOOD ADMINISTERED:none  DRAINS: Gastrostomy Tube   LOCAL MEDICATIONS USED:  MARCAINE     SPECIMEN:  No Specimen  DISPOSITION OF SPECIMEN:  N/A  COUNTS:  YES  TOURNIQUET:  * No tourniquets in log *  DICTATION: .Other Dictation: Dictation Number [id number]  PLAN OF CARE: Admit to inpatient   PATIENT DISPOSITION:  PACU - hemodynamically stable.   Delay start of Pharmacological VTE agent (>24hrs) due to surgical blood loss or risk of bleeding: no

## 2012-12-15 NOTE — Progress Notes (Signed)
CARE MANAGEMENT NOTE 12/15/2012  Patient:  Jill Hamilton, Jill Hamilton   Account Number:  [id number]  Date Initiated:  12/15/2012  Documentation initiated by:  Brantley Wiley  Subjective/Objective Assessment:   post surg hypotension s/p repair of giant hital hernia     Action/Plan:   home   Anticipated DC Date:  12/18/2012   Anticipated DC Plan:  HOME/SELF CARE         Choice offered to / List presented to:             Status of service:  In process, will continue to follow Medicare Important Message given?   (If response is "NO", the following Medicare IM given date fields will be blank) Date Medicare IM given:   Date Additional Medicare IM given:    Discharge Disposition:    Per UR Regulation:  Reviewed for med. necessity/level of care/duration of stay  If discussed at Long Length of Stay Meetings, dates discussed:    Comments:  Anola Gurney

## 2012-12-15 NOTE — Anesthesia Postprocedure Evaluation (Signed)
  Anesthesia Post-op Note  Patient: Jill Hamilton  Procedure(s) Performed: Procedure(s) (LRB): LAPAROSCOPIC REPAIR OF HIATAL HERNIA () GASTROSTOMY TUBE (N/A)  Patient Location: PACU  Anesthesia Type: General  Level of Consciousness: awake and alert   Airway and Oxygen Therapy: Patient Spontanous Breathing  Post-op Pain: mild  Post-op Assessment: Post-op Vital signs reviewed, Patient's Cardiovascular Status Stable, Respiratory Function Stable, Patent Airway and No signs of Nausea or vomiting  Last Vitals:  Filed Vitals:   12/15/12 1200  BP:   Pulse:   Temp: 35.9 C  Resp:     Post-op Vital Signs: stable   Complications: No apparent anesthesia complications

## 2012-12-16 ENCOUNTER — Encounter (HOSPITAL_COMMUNITY): Payer: Self-pay | Admitting: Surgery

## 2012-12-16 ENCOUNTER — Inpatient Hospital Stay (HOSPITAL_COMMUNITY): Payer: Medicare Other

## 2012-12-16 DIAGNOSIS — K449 Diaphragmatic hernia without obstruction or gangrene: Secondary | ICD-10-CM | POA: Diagnosis not present

## 2012-12-16 LAB — BASIC METABOLIC PANEL
BUN: 13 mg/dL (ref 6–23)
CO2: 24 mEq/L (ref 19–32)
Chloride: 103 mEq/L (ref 96–112)
Creatinine, Ser: 0.74 mg/dL (ref 0.50–1.10)
GFR calc Af Amer: >90 mL/min (ref >90)
Glucose, Bld: 151 mg/dL — ABNORMAL HIGH (ref 70–99)

## 2012-12-16 LAB — CBC
HCT: 33.8 % — ABNORMAL LOW (ref 36.0–46.0)
MCV: 94.9 fL (ref 78.0–100.0)
RDW: 13.3 % (ref 11.5–15.5)
WBC: 10 10*3/uL (ref 4.0–10.5)

## 2012-12-16 LAB — GLUCOSE, CAPILLARY
Glucose-Capillary: 140 mg/dL — ABNORMAL HIGH (ref 70–99)
Glucose-Capillary: 60 mg/dL — ABNORMAL LOW (ref 70–99)
Glucose-Capillary: 76 mg/dL (ref 70–99)
Glucose-Capillary: 82 mg/dL (ref 70–99)

## 2012-12-16 MED ORDER — DEXTROSE 50 % IV SOLN
INTRAVENOUS | Status: AC
  Administered 2012-12-16: 25 mL
  Filled 2012-12-16: qty 50

## 2012-12-16 MED ORDER — BOOST / RESOURCE BREEZE PO LIQD
1.0000 | Freq: Three times a day (TID) | ORAL | Status: DC
  Administered 2012-12-16 – 2012-12-19 (×6): 1 via ORAL

## 2012-12-16 MED ORDER — DEXTROSE 50 % IV SOLN
INTRAVENOUS | Status: AC
  Filled 2012-12-16: qty 50

## 2012-12-16 MED ORDER — IOHEXOL 300 MG/ML  SOLN: 50.0000 mL | Freq: Once | INTRAMUSCULAR | Status: AC | PRN

## 2012-12-16 MED ORDER — DEXTROSE 50 % IV SOLN
25.0000 mL | Freq: Once | INTRAVENOUS | Status: AC | PRN
  Administered 2012-12-16: 25 mL via INTRAVENOUS

## 2012-12-16 NOTE — Progress Notes (Signed)
Patient ID: Jill Hamilton, female   DOB: [birth year], 79 y.o.   MRN: [id number] [facility] Progress Note:   1 Day Post-Op  Subjective: Mental status is clear Objective: Vital signs in last 24 hours: Temp:  [97.3 F (36.3 C)-100.7 F (38.2 C)] 98.6 F (37 C) (02/05 1200) Pulse Rate:  [83-98] 90  (02/05 1006) Resp:  [12-23] 19  (02/05 1006) BP: (94-120)/(43-73) 118/54 mmHg (02/05 1006) SpO2:  [91 %-98 %] 95 % (02/05 1006) Weight:  [125 lb 7.1 oz (56.9 kg)] 125 lb 7.1 oz (56.9 kg) (02/04 1300)  Intake/Output from previous day: 02/04 0701 - 02/05 0700 In: 5350 [I.V.:5350] Out: 925 [Urine:925] Intake/Output this shift: Total I/O In: -  Out: 275 [Urine:275]  Physical Exam: Work of breathing is OK.  Abdomen is sore as expected  Lab Results:  Results for orders placed during the hospital encounter of 12/15/12 (from the past 48 hour(s))  GLUCOSE, CAPILLARY     Status: Abnormal   Collection Time   12/15/12 11:23 AM      Component Value Range Comment   Glucose-Capillary 134 (*) 70 - 99 mg/dL   CBC     Status: Abnormal   Collection Time   12/15/12 12:21 PM      Component Value Range Comment   WBC 8.5  4.0 - 10.5 K/uL    RBC 3.79 (*) 3.87 - 5.11 MIL/uL    Hemoglobin 11.7 (*) 12.0 - 15.0 g/dL    HCT 40.9  81.1 - 91.4 %    MCV 95.5  78.0 - 100.0 fL    MCH 30.9  26.0 - 34.0 pg    MCHC 32.3  30.0 - 36.0 g/dL    RDW 78.2  95.6 - 21.3 %    Platelets 147 (*) 150 - 400 K/uL   CREATININE, SERUM     Status: Abnormal   Collection Time   12/15/12 12:21 PM      Component Value Range Comment   Creatinine, Ser 0.60  0.50 - 1.10 mg/dL    GFR calc non Af Amer 86 (*) >90 mL/min    GFR calc Af Amer >90  >90 mL/min   HEMOGLOBIN A1C     Status: Abnormal   Collection Time   12/15/12 12:25 PM      Component Value Range Comment   Hemoglobin A1C 6.0 (*) <5.7 %    Mean Plasma Glucose 126 (*) <117 mg/dL   GLUCOSE, CAPILLARY     Status: Normal   Collection Time   12/15/12  5:17 PM   Component Value Range Comment   Glucose-Capillary 73  70 - 99 mg/dL   GLUCOSE, CAPILLARY     Status: Normal   Collection Time   12/15/12  7:46 PM      Component Value Range Comment   Glucose-Capillary 76  70 - 99 mg/dL   GLUCOSE, CAPILLARY     Status: Normal   Collection Time   12/15/12 11:50 PM      Component Value Range Comment   Glucose-Capillary 73  70 - 99 mg/dL    Comment 1 Notify RN     GLUCOSE, CAPILLARY     Status: Abnormal   Collection Time   12/16/12  3:26 AM      Component Value Range Comment   Glucose-Capillary 63 (*) 70 - 99 mg/dL    Comment 1 Notify RN     CBC     Status: Abnormal   Collection Time  12/16/12  3:35 AM      Component Value Range Comment   WBC 10.0  4.0 - 10.5 K/uL    RBC 3.56 (*) 3.87 - 5.11 MIL/uL    Hemoglobin 11.0 (*) 12.0 - 15.0 g/dL    HCT 16.1 (*) 09.6 - 46.0 %    MCV 94.9  78.0 - 100.0 fL    MCH 30.9  26.0 - 34.0 pg    MCHC 32.5  30.0 - 36.0 g/dL    RDW 04.5  40.9 - 81.1 %    Platelets 143 (*) 150 - 400 K/uL   BASIC METABOLIC PANEL     Status: Abnormal   Collection Time   12/16/12  3:35 AM      Component Value Range Comment   Sodium 136  135 - 145 mEq/L    Potassium 4.0  3.5 - 5.1 mEq/L    Chloride 103  96 - 112 mEq/L    CO2 24  19 - 32 mEq/L    Glucose, Bld 151 (*) 70 - 99 mg/dL    BUN 13  6 - 23 mg/dL    Creatinine, Ser 9.14  0.50 - 1.10 mg/dL    Calcium 6.8 (*) 8.4 - 10.5 mg/dL    GFR calc non Af Amer 81 (*) >90 mL/min    GFR calc Af Amer >90  >90 mL/min   GLUCOSE, CAPILLARY     Status: Abnormal   Collection Time   12/16/12  3:48 AM      Component Value Range Comment   Glucose-Capillary 145 (*) 70 - 99 mg/dL    Comment 1 Notify RN     GLUCOSE, CAPILLARY     Status: Normal   Collection Time   12/16/12  7:47 AM      Component Value Range Comment   Glucose-Capillary 76  70 - 99 mg/dL    Comment 1 Documented in Chart      Comment 2 Notify RN     GLUCOSE, CAPILLARY     Status: Abnormal   Collection Time   12/16/12 12:00 PM       Component Value Range Comment   Glucose-Capillary 60 (*) 70 - 99 mg/dL    Comment 1 Documented in Chart      Comment 2 Notify RN       Radiology/Results: Dg Ugi W/water Sol Cm  12/16/2012  *RADIOLOGY REPORT*  Clinical Data: Status post hiatal hernia repair.  ESOPHOGRAM/BARIUM SWALLOW  Technique:  Single contrast examination was performed using water- soluble contrast material.  Fluoroscopy time:  3.3 minutes.  Comparison:  08/17/2012  Findings:  Given the patients recent surgery, the exam was performed in a 30 degrees head-up position using both shallow right and left anterior oblique positions relative to the fluoro table.  When given swallows of water soluble contrast material, the contrast can be seen to pass through the region of the esophagogastric junction into the proximal stomach.  Compared to the preoperative upper GI study, the esophagogastric junction now is positioned at the expected anatomic location.  No evidence for contrast extravasation to suggest a leak in the region of the esophagogastric junction.  IMPRESSION: The esophagogastric junction now appears positioned at the expected anatomic location.  Water-soluble contrast material passes through the esophagogastric junction into the proximal esophagus without evidence for extravasation to suggest leak.   Original Report Authenticated By: Kennith Center, M.D.     Anti-infectives: Anti-infectives     Start     Dose/Rate Route  Frequency Ordered Stop   12/15/12 0608   cefOXitin (MEFOXIN) 2 g in dextrose 5 % 50 mL IVPB        2 g 100 mL/hr over 30 Minutes Intravenous On call to O.R. 12/15/12 0608 12/15/12 0747          Assessment/Plan: Problem List: Patient Active Problem List  Diagnosis  . Asthma  . Parkinson disease  . Giant Type III hiatus hernia with entire stomach in the chest    Swallow OK.  Start clear liquids.   1 Day Post-Op    LOS: 1 day   Matt B. Daphine Deutscher, MD, [facility] [phone]  beeper [phone]  12/16/2012 12:55 PM

## 2012-12-16 NOTE — Progress Notes (Signed)
INITIAL NUTRITION ASSESSMENT  DOCUMENTATION CODES Per approved criteria  -Not Applicable   INTERVENTION: Provide Resource Breeze Encouraged PO intake as tolerated  NUTRITION DIAGNOSIS: Unintentional wt loss related to medical conditions as evidenced by reported 20 lb wt loss in past year.   Goal: Pt to meet >/= 90% of their estimated nutrition needs Wt maintenance  Monitor:  Wt PO intake and diet advancement  Reason for Assessment: MST score of 2  79 y.o. female  Admitting Dx: Large type III hiatus hernia    ASSESSMENT: Pt is status post hiatal hernia repair. Pt reports that she has been losing wt for the past year because of poor PO intake due to getting full fast. Appetite is usually good but, poor today per pt. Pt and pt's husband report pt weighed approximately 120 lbs one year ago and weighed 99 lbs in December 2013 and most of the past year. Question accuracy of today's wt of 125 lbs. Pt complains of usual constipation. Discussed diet tips for relieving constipation.     Height: Ht Readings from Last 1 Encounters:  12/15/12 5\' 2"  (1.575 m)    Weight: Wt Readings from Last 1 Encounters:  12/15/12 125 lb 7.1 oz (56.9 kg)    Ideal Body Weight: 110 lb  % Ideal Body Weight: 97% (using wt of 107 lb PTA)  Wt Readings from Last 10 Encounters:  12/15/12 125 lb 7.1 oz (56.9 kg)  12/15/12 125 lb 7.1 oz (56.9 kg)  11/23/12 107 lb (48.535 kg)  09/30/12 100 lb 3.2 oz (45.45 kg)    Usual Body Weight: 120 lb  % Usual Body Weight: 89%  BMI:  Body mass index is 22.94 kg/(m^2).  Estimated Nutritional Needs: Kcal: 1260-1470 Protein: 58-68 grams Fluid: >1.5L  Skin: Abdominal incision  Diet Order: Clear Liquid  EDUCATION NEEDS: -No education needs identified at this time   Intake/Output Summary (Last 24 hours) at 12/16/12 1410 Last data filed at 12/16/12 0830  Gross per 24 hour  Intake   1700 ml  Output   1050 ml  Net    650 ml    Last BM:  2/4  Labs:   Lab 12/16/12 0335 12/15/12 1221  NA 136 --  K 4.0 --  CL 103 --  CO2 24 --  BUN 13 --  CREATININE 0.74 0.60  CALCIUM 6.8* --  MG -- --  PHOS -- --  GLUCOSE 151* --    CBG (last 3)   Basename 12/16/12 1200 12/16/12 0747 12/16/12 0348  GLUCAP 60* 76 145*    Scheduled Meds:   . antiseptic oral rinse  15 mL Mouth Rinse q12n4p  . chlorhexidine  15 mL Mouth Rinse BID  . heparin  5,000 Units Subcutaneous Q8H  . insulin aspart  0-9 Units Subcutaneous Q4H    Continuous Infusions:   . 0.45 % NaCl with KCl 20 mEq / L 100 mL/hr at 12/16/12 1107    Past Medical History  Diagnosis Date  . Hyperlipidemia   . Parkinson's disease   . Diabetes mellitus without complication   . Scoliosis   . Asthma   . Arthritis   . Anxiety     Past Surgical History  Procedure Date  . Abdominal hysterectomy   . Cholecystectomy   . Carpal tunnel release     bilateral  . Hammer toe surgery     bilateral  . Esophagogastroduodenoscopy 08/12/12  . Appendectomy     with hysterectomy  . Bladder surgery  bladder tacked  . Hiatal hernia repair 12/15/2012    laparascopic repair  . Hiatal hernia repair 12/15/2012    Procedure: LAPAROSCOPIC REPAIR OF HIATAL HERNIA;  Surgeon: Valarie Merino, MD;  Location: [facility];  Service: General;;    Ian Malkin RD, LDN Inpatient Clinical Dietitian Pager: [phone] After Hours Pager: [phone]

## 2012-12-16 NOTE — Progress Notes (Signed)
Pt report given to Clarissa,RN. Will transfer to the floor.

## 2012-12-17 LAB — BASIC METABOLIC PANEL
Calcium: 7 mg/dL — ABNORMAL LOW (ref 8.4–10.5)
GFR calc Af Amer: >90 mL/min (ref >90)
GFR calc non Af Amer: 85 mL/min — ABNORMAL LOW (ref >90)
Glucose, Bld: 109 mg/dL — ABNORMAL HIGH (ref 70–99)
Sodium: 137 mEq/L (ref 135–145)

## 2012-12-17 LAB — GLUCOSE, CAPILLARY
Glucose-Capillary: 105 mg/dL — ABNORMAL HIGH (ref 70–99)
Glucose-Capillary: 79 mg/dL (ref 70–99)

## 2012-12-17 LAB — CBC
MCH: 30.9 pg (ref 26.0–34.0)
MCHC: 32.9 g/dL (ref 30.0–36.0)
Platelets: 148 10*3/uL — ABNORMAL LOW (ref 150–400)
RDW: 13.6 % (ref 11.5–15.5)

## 2012-12-17 MED ORDER — CITALOPRAM HYDROBROMIDE 40 MG PO TABS
40.0000 mg | ORAL_TABLET | Freq: Every morning | ORAL | Status: DC
  Administered 2012-12-17 – 2012-12-19 (×3): 40 mg via ORAL
  Filled 2012-12-17 (×3): qty 1

## 2012-12-17 MED ORDER — ALPRAZOLAM 0.25 MG PO TABS
0.2500 mg | ORAL_TABLET | Freq: Two times a day (BID) | ORAL | Status: DC | PRN
Start: 2012-12-17 — End: 2012-12-19

## 2012-12-17 MED ORDER — RALOXIFENE HCL 60 MG PO TABS
60.0000 mg | ORAL_TABLET | Freq: Every day | ORAL | Status: DC
  Administered 2012-12-17 – 2012-12-19 (×3): 60 mg via ORAL
  Filled 2012-12-17 (×3): qty 1

## 2012-12-17 MED ORDER — OMEGA-3 FATTY ACIDS 1000 MG PO CAPS: 3.0000 g | ORAL_CAPSULE | Freq: Every day | ORAL | Status: DC

## 2012-12-17 MED ORDER — EZETIMIBE 10 MG PO TABS
10.0000 mg | ORAL_TABLET | Freq: Every day | ORAL | Status: DC
  Administered 2012-12-17 – 2012-12-18 (×2): 10 mg via ORAL
  Filled 2012-12-17 (×3): qty 1

## 2012-12-17 MED ORDER — DOCUSATE SODIUM 100 MG PO CAPS
300.0000 mg | ORAL_CAPSULE | Freq: Every day | ORAL | Status: DC
  Filled 2012-12-17 (×3): qty 4

## 2012-12-17 MED ORDER — CARBIDOPA-LEVODOPA 25-100 MG PO TABS
1.5000 | ORAL_TABLET | ORAL | Status: DC
  Administered 2012-12-17 – 2012-12-19 (×6): 1.5 via ORAL
  Filled 2012-12-17 (×9): qty 1.5

## 2012-12-17 MED ORDER — CARBIDOPA-LEVODOPA-ENTACAPONE 37.5-150-200 MG PO TABS
1.0000 | ORAL_TABLET | Freq: Three times a day (TID) | ORAL | Status: DC
Start: 2012-12-17 — End: 2012-12-17

## 2012-12-17 MED ORDER — PANTOPRAZOLE SODIUM 40 MG PO TBEC
80.0000 mg | DELAYED_RELEASE_TABLET | Freq: Every day | ORAL | Status: DC
  Administered 2012-12-17 – 2012-12-19 (×3): 80 mg via ORAL
  Filled 2012-12-17 (×3): qty 2

## 2012-12-17 MED ORDER — ENTACAPONE 200 MG PO TABS
200.0000 mg | ORAL_TABLET | ORAL | Status: DC
  Administered 2012-12-17 – 2012-12-19 (×6): 200 mg via ORAL
  Filled 2012-12-17 (×9): qty 1

## 2012-12-17 MED ORDER — NIACIN 100 MG PO TABS
100.0000 mg | ORAL_TABLET | Freq: Every day | ORAL | Status: DC
  Administered 2012-12-18 – 2012-12-19 (×2): 100 mg via ORAL
  Filled 2012-12-17 (×3): qty 1

## 2012-12-17 MED ORDER — OMEGA-3-ACID ETHYL ESTERS 1 G PO CAPS
3.0000 g | ORAL_CAPSULE | Freq: Every day | ORAL | Status: DC
  Filled 2012-12-17 (×3): qty 3

## 2012-12-17 NOTE — Progress Notes (Signed)
Patient ID: Jill Hamilton, female   DOB: [birth year], 79 y.o.   MRN: [id number] [facility] Progress Note:   2 Days Post-Op  Subjective: Mental status is fairly clear.  No complaints.   Objective: Vital signs in last 24 hours: Temp:  [97.6 F (36.4 C)-99.3 F (37.4 C)] 97.6 F (36.4 C) (02/06 0527) Pulse Rate:  [87-90] 87  (02/06 0527) Resp:  [18-20] 20  (02/06 0527) BP: (118-141)/(54-72) 141/72 mmHg (02/06 0527) SpO2:  [95 %-98 %] 95 % (02/06 0527)  Intake/Output from previous day: 02/05 0701 - 02/06 0700 In: 2600 [P.O.:300; I.V.:2300] Out: 1475 [Urine:1475] Intake/Output this shift:    Physical Exam: Work of breathing is good for her.  G tube anchored and sealed.  No abdominal pain  Lab Results:  Results for orders placed during the hospital encounter of 12/15/12 (from the past 48 hour(s))  GLUCOSE, CAPILLARY     Status: Abnormal   Collection Time   12/15/12 11:23 AM      Component Value Range Comment   Glucose-Capillary 134 (*) 70 - 99 mg/dL   CBC     Status: Abnormal   Collection Time   12/15/12 12:21 PM      Component Value Range Comment   WBC 8.5  4.0 - 10.5 K/uL    RBC 3.79 (*) 3.87 - 5.11 MIL/uL    Hemoglobin 11.7 (*) 12.0 - 15.0 g/dL    HCT 40.9  81.1 - 91.4 %    MCV 95.5  78.0 - 100.0 fL    MCH 30.9  26.0 - 34.0 pg    MCHC 32.3  30.0 - 36.0 g/dL    RDW 78.2  95.6 - 21.3 %    Platelets 147 (*) 150 - 400 K/uL   CREATININE, SERUM     Status: Abnormal   Collection Time   12/15/12 12:21 PM      Component Value Range Comment   Creatinine, Ser 0.60  0.50 - 1.10 mg/dL    GFR calc non Af Amer 86 (*) >90 mL/min    GFR calc Af Amer >90  >90 mL/min   HEMOGLOBIN A1C     Status: Abnormal   Collection Time   12/15/12 12:25 PM      Component Value Range Comment   Hemoglobin A1C 6.0 (*) <5.7 %    Mean Plasma Glucose 126 (*) <117 mg/dL   GLUCOSE, CAPILLARY     Status: Normal   Collection Time   12/15/12  5:17 PM      Component Value Range Comment   Glucose-Capillary 73  70 - 99 mg/dL   GLUCOSE, CAPILLARY     Status: Normal   Collection Time   12/15/12  7:46 PM      Component Value Range Comment   Glucose-Capillary 76  70 - 99 mg/dL   GLUCOSE, CAPILLARY     Status: Normal   Collection Time   12/15/12 11:50 PM      Component Value Range Comment   Glucose-Capillary 73  70 - 99 mg/dL    Comment 1 Notify RN     GLUCOSE, CAPILLARY     Status: Abnormal   Collection Time   12/16/12  3:26 AM      Component Value Range Comment   Glucose-Capillary 63 (*) 70 - 99 mg/dL    Comment 1 Notify RN     CBC     Status: Abnormal   Collection Time   12/16/12  3:35 AM  Component Value Range Comment   WBC 10.0  4.0 - 10.5 K/uL    RBC 3.56 (*) 3.87 - 5.11 MIL/uL    Hemoglobin 11.0 (*) 12.0 - 15.0 g/dL    HCT 95.6 (*) 21.3 - 46.0 %    MCV 94.9  78.0 - 100.0 fL    MCH 30.9  26.0 - 34.0 pg    MCHC 32.5  30.0 - 36.0 g/dL    RDW 08.6  57.8 - 46.9 %    Platelets 143 (*) 150 - 400 K/uL   BASIC METABOLIC PANEL     Status: Abnormal   Collection Time   12/16/12  3:35 AM      Component Value Range Comment   Sodium 136  135 - 145 mEq/L    Potassium 4.0  3.5 - 5.1 mEq/L    Chloride 103  96 - 112 mEq/L    CO2 24  19 - 32 mEq/L    Glucose, Bld 151 (*) 70 - 99 mg/dL    BUN 13  6 - 23 mg/dL    Creatinine, Ser 6.29  0.50 - 1.10 mg/dL    Calcium 6.8 (*) 8.4 - 10.5 mg/dL    GFR calc non Af Amer 81 (*) >90 mL/min    GFR calc Af Amer >90  >90 mL/min   GLUCOSE, CAPILLARY     Status: Abnormal   Collection Time   12/16/12  3:48 AM      Component Value Range Comment   Glucose-Capillary 145 (*) 70 - 99 mg/dL    Comment 1 Notify RN     GLUCOSE, CAPILLARY     Status: Normal   Collection Time   12/16/12  7:47 AM      Component Value Range Comment   Glucose-Capillary 76  70 - 99 mg/dL    Comment 1 Documented in Chart      Comment 2 Notify RN     GLUCOSE, CAPILLARY     Status: Abnormal   Collection Time   12/16/12 12:00 PM      Component Value Range Comment    Glucose-Capillary 60 (*) 70 - 99 mg/dL    Comment 1 Documented in Chart      Comment 2 Notify RN     GLUCOSE, CAPILLARY     Status: Abnormal   Collection Time   12/16/12  1:03 PM      Component Value Range Comment   Glucose-Capillary 140 (*) 70 - 99 mg/dL    Comment 1 Documented in Chart      Comment 2 Notify RN     GLUCOSE, CAPILLARY     Status: Normal   Collection Time   12/16/12  5:06 PM      Component Value Range Comment   Glucose-Capillary 91  70 - 99 mg/dL   GLUCOSE, CAPILLARY     Status: Abnormal   Collection Time   12/16/12  7:59 PM      Component Value Range Comment   Glucose-Capillary 136 (*) 70 - 99 mg/dL    Comment 1 Documented in Chart     GLUCOSE, CAPILLARY     Status: Normal   Collection Time   12/16/12 11:59 PM      Component Value Range Comment   Glucose-Capillary 82  70 - 99 mg/dL   GLUCOSE, CAPILLARY     Status: Abnormal   Collection Time   12/17/12  4:06 AM      Component Value Range Comment   Glucose-Capillary  105 (*) 70 - 99 mg/dL   CBC     Status: Abnormal   Collection Time   12/17/12  4:10 AM      Component Value Range Comment   WBC 7.9  4.0 - 10.5 K/uL    RBC 3.79 (*) 3.87 - 5.11 MIL/uL    Hemoglobin 11.7 (*) 12.0 - 15.0 g/dL    HCT 40.9 (*) 81.1 - 46.0 %    MCV 93.9  78.0 - 100.0 fL    MCH 30.9  26.0 - 34.0 pg    MCHC 32.9  30.0 - 36.0 g/dL    RDW 91.4  78.2 - 95.6 %    Platelets 148 (*) 150 - 400 K/uL   BASIC METABOLIC PANEL     Status: Abnormal   Collection Time   12/17/12  4:10 AM      Component Value Range Comment   Sodium 137  135 - 145 mEq/L    Potassium 4.0  3.5 - 5.1 mEq/L    Chloride 106  96 - 112 mEq/L    CO2 22  19 - 32 mEq/L    Glucose, Bld 109 (*) 70 - 99 mg/dL    BUN 10  6 - 23 mg/dL    Creatinine, Ser 2.13  0.50 - 1.10 mg/dL    Calcium 7.0 (*) 8.4 - 10.5 mg/dL    GFR calc non Af Amer 85 (*) >90 mL/min    GFR calc Af Amer >90  >90 mL/min   GLUCOSE, CAPILLARY     Status: Normal   Collection Time   12/17/12  7:46 AM      Component  Value Range Comment   Glucose-Capillary 93  70 - 99 mg/dL     Radiology/Results: Dg Ugi W/water Sol Cm  12/16/2012  *RADIOLOGY REPORT*  Clinical Data: Status post hiatal hernia repair.  ESOPHOGRAM/BARIUM SWALLOW  Technique:  Single contrast examination was performed using water- soluble contrast material.  Fluoroscopy time:  3.3 minutes.  Comparison:  08/17/2012  Findings:  Given the patients recent surgery, the exam was performed in a 30 degrees head-up position using both shallow right and left anterior oblique positions relative to the fluoro table.  When given swallows of water soluble contrast material, the contrast can be seen to pass through the region of the esophagogastric junction into the proximal stomach.  Compared to the preoperative upper GI study, the esophagogastric junction now is positioned at the expected anatomic location.  No evidence for contrast extravasation to suggest a leak in the region of the esophagogastric junction.  IMPRESSION: The esophagogastric junction now appears positioned at the expected anatomic location.  Water-soluble contrast material passes through the esophagogastric junction into the proximal esophagus without evidence for extravasation to suggest leak.   Original Report Authenticated By: Kennith Center, M.D.     Anti-infectives: Anti-infectives     Start     Dose/Rate Route Frequency Ordered Stop   12/15/12 0608   cefOXitin (MEFOXIN) 2 g in dextrose 5 % 50 mL IVPB        2 g 100 mL/hr over 30 Minutes Intravenous On call to O.R. 12/15/12 0608 12/15/12 0747          Assessment/Plan: Problem List: Patient Active Problem List  Diagnosis  . Asthma  . Parkinson disease  . Giant Type III hiatus hernia with entire stomach in the chest   Will advance to full liquid diet.   2 Days Post-Op    LOS: 2 days  Matt B. Daphine Deutscher, MD, [facility] [phone] beeper [phone]  12/17/2012 9:01 AM

## 2012-12-18 LAB — GLUCOSE, CAPILLARY
Glucose-Capillary: 103 mg/dL — ABNORMAL HIGH (ref 70–99)
Glucose-Capillary: 79 mg/dL (ref 70–99)
Glucose-Capillary: 84 mg/dL (ref 70–99)
Glucose-Capillary: 119 mg/dL — ABNORMAL HIGH (ref 70–99)

## 2012-12-18 MED ORDER — BISACODYL 10 MG RE SUPP
RECTAL | Status: AC
  Filled 2012-12-18: qty 1

## 2012-12-18 MED ORDER — BISACODYL 10 MG RE SUPP
10.0000 mg | Freq: Once | RECTAL | Status: AC
  Administered 2012-12-18: 10 mg via RECTAL

## 2012-12-18 NOTE — Progress Notes (Signed)
Patient ID: Jill Hamilton, female   DOB: 1934-05-07, 79 y.o.   MRN: [id number] [facility] Progress Note:   3 Days Post-Op  Subjective: Mental status is clear.  Back on Parkinson meds Objective: Vital signs in last 24 hours: Temp:  [97.2 F (36.2 C)-97.8 F (36.6 C)] 97.8 F (36.6 C) (02/07 0511) Pulse Rate:  [73-84] 73  (02/07 0511) Resp:  [18] 18  (02/07 0511) BP: (119-136)/(74-78) 136/74 mmHg (02/07 0511) SpO2:  [93 %-94 %] 94 % (02/07 0511)  Intake/Output from previous day: 02/06 0701 - 02/07 0700 In: 1387.5 [P.O.:20; I.V.:1367.5] Out: 1100 [Urine:1100] Intake/Output this shift:    Physical Exam: Work of breathing is normal.  G tube in place.  Explained to her  Lab Results:  Results for orders placed during the hospital encounter of 12/15/12 (from the past 48 hour(s))  GLUCOSE, CAPILLARY     Status: Abnormal   Collection Time   12/16/12 12:00 PM      Component Value Range Comment   Glucose-Capillary 60 (*) 70 - 99 mg/dL    Comment 1 Documented in Chart      Comment 2 Notify RN     GLUCOSE, CAPILLARY     Status: Abnormal   Collection Time   12/16/12  1:03 PM      Component Value Range Comment   Glucose-Capillary 140 (*) 70 - 99 mg/dL    Comment 1 Documented in Chart      Comment 2 Notify RN     GLUCOSE, CAPILLARY     Status: Normal   Collection Time   12/16/12  5:06 PM      Component Value Range Comment   Glucose-Capillary 91  70 - 99 mg/dL   GLUCOSE, CAPILLARY     Status: Abnormal   Collection Time   12/16/12  7:59 PM      Component Value Range Comment   Glucose-Capillary 136 (*) 70 - 99 mg/dL    Comment 1 Documented in Chart     GLUCOSE, CAPILLARY     Status: Normal   Collection Time   12/16/12 11:59 PM      Component Value Range Comment   Glucose-Capillary 82  70 - 99 mg/dL   GLUCOSE, CAPILLARY     Status: Abnormal   Collection Time   12/17/12  4:06 AM      Component Value Range Comment   Glucose-Capillary 105 (*) 70 - 99 mg/dL   CBC     Status:  Abnormal   Collection Time   12/17/12  4:10 AM      Component Value Range Comment   WBC 7.9  4.0 - 10.5 K/uL    RBC 3.79 (*) 3.87 - 5.11 MIL/uL    Hemoglobin 11.7 (*) 12.0 - 15.0 g/dL    HCT 56.2 (*) 13.0 - 46.0 %    MCV 93.9  78.0 - 100.0 fL    MCH 30.9  26.0 - 34.0 pg    MCHC 32.9  30.0 - 36.0 g/dL    RDW 86.5  78.4 - 69.6 %    Platelets 148 (*) 150 - 400 K/uL   BASIC METABOLIC PANEL     Status: Abnormal   Collection Time   12/17/12  4:10 AM      Component Value Range Comment   Sodium 137  135 - 145 mEq/L    Potassium 4.0  3.5 - 5.1 mEq/L    Chloride 106  96 - 112 mEq/L  CO2 22  19 - 32 mEq/L    Glucose, Bld 109 (*) 70 - 99 mg/dL    BUN 10  6 - 23 mg/dL    Creatinine, Ser 1.61  0.50 - 1.10 mg/dL    Calcium 7.0 (*) 8.4 - 10.5 mg/dL    GFR calc non Af Amer 85 (*) >90 mL/min    GFR calc Af Amer >90  >90 mL/min   GLUCOSE, CAPILLARY     Status: Normal   Collection Time   12/17/12  7:46 AM      Component Value Range Comment   Glucose-Capillary 93  70 - 99 mg/dL   GLUCOSE, CAPILLARY     Status: Abnormal   Collection Time   12/17/12 11:58 AM      Component Value Range Comment   Glucose-Capillary 160 (*) 70 - 99 mg/dL   GLUCOSE, CAPILLARY     Status: Normal   Collection Time   12/17/12  4:44 PM      Component Value Range Comment   Glucose-Capillary 87  70 - 99 mg/dL   GLUCOSE, CAPILLARY     Status: Normal   Collection Time   12/17/12  7:34 PM      Component Value Range Comment   Glucose-Capillary 94  70 - 99 mg/dL   GLUCOSE, CAPILLARY     Status: Normal   Collection Time   12/17/12  8:43 PM      Component Value Range Comment   Glucose-Capillary 98  70 - 99 mg/dL   GLUCOSE, CAPILLARY     Status: Normal   Collection Time   12/17/12 11:46 PM      Component Value Range Comment   Glucose-Capillary 79  70 - 99 mg/dL   GLUCOSE, CAPILLARY     Status: Abnormal   Collection Time   12/18/12  3:58 AM      Component Value Range Comment   Glucose-Capillary 103 (*) 70 - 99 mg/dL      Radiology/Results: No results found.  Anti-infectives: Anti-infectives     Start     Dose/Rate Route Frequency Ordered Stop   12/15/12 0608   cefOXitin (MEFOXIN) 2 g in dextrose 5 % 50 mL IVPB        2 g 100 mL/hr over 30 Minutes Intravenous On call to O.R. 12/15/12 0608 12/15/12 0747          Assessment/Plan: Problem List: Patient Active Problem List  Diagnosis  . Asthma  . Parkinson disease  . Giant Type III hiatus hernia with entire stomach in the chest    Hypoglycemic during the night with glucose of 60.  Taking po well.  Hopeful discharge tomorrow.  3 Days Post-Op    LOS: 3 days   Matt B. Daphine Deutscher, MD, [facility] [phone] beeper [phone]  12/18/2012 10:33 AM

## 2012-12-19 LAB — GLUCOSE, CAPILLARY
Glucose-Capillary: 86 mg/dL (ref 70–99)
Glucose-Capillary: 87 mg/dL (ref 70–99)

## 2012-12-19 NOTE — Progress Notes (Signed)
Pt discharged to home. Dc instructions given using teach back method. Husband at bedside at time of discharge. No concerns voiced.

## 2012-12-19 NOTE — Progress Notes (Signed)
Pt left unit in wheelchair pushed by nurse tech accompanied by husband and son. Left in good condition. Vwilliams, rn.

## 2012-12-19 NOTE — Discharge Summary (Signed)
Physician Discharge Summary  Patient ID: Jill Hamilton MRN: [id number] DOB/AGE: 1933-11-20 79 y.o.  Admit date: 12/15/2012 Discharge date: 12/19/2012  Admission Diagnoses:  Giant type III hiatus hernia  Discharge Diagnoses:  Same, post takedown of hiatal hernia (stomach in chest); closure of hiatus and g tube pexing of stomach into the abdomen  Active Problems:   * No active hospital problems. *   Surgery:  Lap repair of hiatus hernia with G tube pexing  Discharged Condition: improved  Hospital Course:   Had surgery.  Swallow on PD 1.  Diet started and advanced slowly.  Ready for discharge  Consults: none  Significant Diagnostic Studies: UGI series    Discharge Exam: Blood pressure 143/82, pulse 74, temperature 98.4 F (36.9 C), temperature source Oral, resp. rate 18, height 5\' 2"  (1.575 m), weight 125 lb 7.1 oz (56.9 kg), SpO2 93.00%. Nontender.  Taking po.  Disposition:   Discharge Orders   Future Orders Complete By Expires     Diet - low sodium heart healthy  As directed     Comments:      See diet above    Discharge instructions  As directed     Comments:      Full liquid diet for 1 week after surgery followed by pureed foods for 4 weeks. May shower by Monday and redress around the G tube.    Increase activity slowly  As directed         Medication List    STOP taking these medications       calcium & magnesium carbonates 311-232 MG per tablet  Commonly known as:  MYLANTA      TAKE these medications       albuterol 108 (90 BASE) MCG/ACT inhaler  Commonly known as:  PROVENTIL HFA;VENTOLIN HFA  Inhale 2 puffs into the lungs every 6 (six) hours as needed. For shortness of breath     ALPRAZolam 0.5 MG tablet  Commonly known as:  XANAX  Take 0.25 mg by mouth 2 (two) times daily as needed. For anxiety     carbidopa-levodopa-entacapone 37.5-150-200 MG per tablet  Commonly known as:  STALEVO  Take 1 tablet by mouth 3 (three) times daily.     citalopram 40  MG tablet  Commonly known as:  CELEXA  Take 40 mg by mouth every morning.     docusate sodium 100 MG capsule  Commonly known as:  COLACE  Take 300-400 mg by mouth at bedtime.     ezetimibe 10 MG tablet  Commonly known as:  ZETIA  Take 10 mg by mouth at bedtime.     fish oil-omega-3 fatty acids 1000 MG capsule  Take 3 g by mouth daily.     HYDROcodone-acetaminophen 5-325 MG per tablet  Commonly known as:  NORCO/VICODIN  Take 1 tablet by mouth every 6 (six) hours as needed. For pain     niacin 100 MG tablet  Take 100 mg by mouth daily with breakfast.     omeprazole 40 MG capsule  Commonly known as:  PRILOSEC  Take 40 mg by mouth every morning.     PREVIDENT 5000 DRY MOUTH 1.1 % Pste  Generic drug:  Sodium Fluoride  Place 1 application onto teeth 3 (three) times a week.     raloxifene 60 MG tablet  Commonly known as:  EVISTA  Take 60 mg by mouth daily.           Follow-up Information   Follow up with  Valarie Merino, MD.   Contact information:   [address] [place] [address] [phone]       Signed: Valarie Merino 12/19/2012, 10:01 AM

## 2012-12-21 ENCOUNTER — Telehealth (INDEPENDENT_AMBULATORY_CARE_PROVIDER_SITE_OTHER): Payer: Self-pay

## 2012-12-21 NOTE — Care Management Note (Signed)
    Page 1 of 1   12/21/2012     10:21:33 AM   CARE MANAGEMENT NOTE 12/21/2012  Patient:  Jill Hamilton, Jill Hamilton   Account Number:  [id number]  Date Initiated:  12/15/2012  Documentation initiated by:  DAVIS,RHONDA  Subjective/Objective Assessment:   post surg hypotension s/p repair of giant hital hernia     Action/Plan:   home   Anticipated DC Date:  12/18/2012   Anticipated DC Plan:  HOME/SELF CARE         Choice offered to / List presented to:             Status of service:  Completed, signed off Medicare Important Message given?   (If response is "NO", the following Medicare IM given date fields will be blank) Date Medicare IM given:   Date Additional Medicare IM given:    Discharge Disposition:  HOME/SELF CARE  Per UR Regulation:  Reviewed for med. necessity/level of care/duration of stay  If discussed at Long Length of Stay Meetings, dates discussed:    Comments:  Anola Gurney

## 2012-12-21 NOTE — Telephone Encounter (Signed)
Called pt to give her her po appt date and time: Thursday, Feb 20th @11a .  I asked the pt if she had any staples or sutures, she stated yes.  I then set a nurse only visit for her for Friday, Feb 14th @ 2p to see me to have the sutures removed.  This is also a day that MM will be in the office, so I let her know that he may want to see her then as well.

## 2012-12-25 ENCOUNTER — Encounter (INDEPENDENT_AMBULATORY_CARE_PROVIDER_SITE_OTHER): Payer: Medicare Other

## 2012-12-28 ENCOUNTER — Encounter (INDEPENDENT_AMBULATORY_CARE_PROVIDER_SITE_OTHER): Payer: Self-pay

## 2012-12-28 ENCOUNTER — Ambulatory Visit (INDEPENDENT_AMBULATORY_CARE_PROVIDER_SITE_OTHER): Payer: Medicare Other

## 2012-12-28 VITALS — BP 112/64 | HR 70 | Temp 98.0°F | Resp 18 | Ht 67.0 in | Wt 106.0 lb

## 2012-12-28 DIAGNOSIS — IMO0001 Reserved for inherently not codable concepts without codable children: Secondary | ICD-10-CM

## 2012-12-28 NOTE — Progress Notes (Signed)
assessed abdomen area for sutures; No sutures to be removed. G-tube intact with sutures in place; Lower G-tube area with redness noted; Cleansed area with saline; Applied drain sponge; secured with tape; educated patient on keeping the area clean and dry, and how to apply drain sponge. Patient and husband verbalized understanding. Patient has appt with Dr. Daphine Deutscher on 12/31/12@ 11am. Advised patient to call with any changes or concerns before  OV .

## 2012-12-31 ENCOUNTER — Encounter (INDEPENDENT_AMBULATORY_CARE_PROVIDER_SITE_OTHER): Payer: Self-pay | Admitting: Surgery

## 2012-12-31 ENCOUNTER — Ambulatory Visit (INDEPENDENT_AMBULATORY_CARE_PROVIDER_SITE_OTHER): Payer: Medicare Other | Admitting: Surgery

## 2012-12-31 VITALS — BP 110/68 | HR 74 | Resp 16 | Ht 62.5 in | Wt 102.0 lb

## 2012-12-31 DIAGNOSIS — Z8719 Personal history of other diseases of the digestive system: Secondary | ICD-10-CM

## 2012-12-31 DIAGNOSIS — J309 Allergic rhinitis, unspecified: Secondary | ICD-10-CM | POA: Diagnosis not present

## 2012-12-31 DIAGNOSIS — Z9889 Other specified postprocedural states: Secondary | ICD-10-CM

## 2012-12-31 NOTE — Progress Notes (Signed)
Large type 3 hiatal hernia with entire stomach  and her chest status post takedown of large type 3 mixed hiatal hernia  with free takedown of the hiatal hernia, closure of the hiatal hernia  posteriorly with fixation of the stomach in the abdomen using a  gastropexy and tube gastrostomy.  Jill Hamilton 79 y.o.  Body mass index is 18.35 kg/(m^2).  Patient Active Problem List  Diagnosis  . Asthma  . Parkinson disease  . Giant Type III hiatus hernia with entire stomach in the chest    Allergies  Allergen Reactions  . Iohexol      Code: HIVES, Desc: reaction after a heart cath per pt--needs 13 hour prep, Onset Date: 16109604     Past Surgical History  Procedure Laterality Date  . Abdominal hysterectomy    . Cholecystectomy    . Carpal tunnel release      bilateral  . Hammer toe surgery      bilateral  . Esophagogastroduodenoscopy  08/12/12  . Appendectomy      with hysterectomy  . Bladder surgery      bladder tacked  . Hiatal hernia repair  12/15/2012    laparascopic repair  . Hiatal hernia repair  12/15/2012    Procedure: LAPAROSCOPIC REPAIR OF HIATAL HERNIA;  Surgeon: Valarie Merino, MD;  Location: [facility];  Service: General;;   [name], MD No diagnosis found.  G tube in place to pex stomach in the abdomen.  She has a good appetite and I discussed advancing her diet.  Will see her back in 4 weeks to remove her G tube.  Matt B. Daphine Deutscher, MD, [facility] [phone] beeper [phone]  12/31/2012 10:59 AM

## 2013-01-05 DIAGNOSIS — J309 Allergic rhinitis, unspecified: Secondary | ICD-10-CM | POA: Diagnosis not present

## 2013-01-13 DIAGNOSIS — J309 Allergic rhinitis, unspecified: Secondary | ICD-10-CM | POA: Diagnosis not present

## 2013-01-21 DIAGNOSIS — J309 Allergic rhinitis, unspecified: Secondary | ICD-10-CM | POA: Diagnosis not present

## 2013-01-26 ENCOUNTER — Encounter (INDEPENDENT_AMBULATORY_CARE_PROVIDER_SITE_OTHER): Payer: Self-pay | Admitting: Surgery

## 2013-01-26 ENCOUNTER — Ambulatory Visit (INDEPENDENT_AMBULATORY_CARE_PROVIDER_SITE_OTHER): Payer: Medicare Other | Admitting: Surgery

## 2013-01-26 VITALS — BP 134/60 | HR 84 | Temp 98.0°F | Resp 20 | Ht 62.5 in | Wt 104.0 lb

## 2013-01-26 DIAGNOSIS — Z8719 Personal history of other diseases of the digestive system: Secondary | ICD-10-CM

## 2013-01-26 DIAGNOSIS — J309 Allergic rhinitis, unspecified: Secondary | ICD-10-CM | POA: Diagnosis not present

## 2013-01-26 DIAGNOSIS — Z9889 Other specified postprocedural states: Secondary | ICD-10-CM

## 2013-01-26 MED ORDER — OMEPRAZOLE 40 MG PO CPDR: 40.0000 mg | DELAYED_RELEASE_CAPSULE | Freq: Every morning | ORAL | Status: DC

## 2013-01-26 NOTE — Progress Notes (Signed)
Jill Hamilton 79 y.o.  Body mass index is 18.71 kg/(m^2).  Patient Active Problem List  Diagnosis  . Asthma  . Parkinson disease  . Repair large type III Hiatus hernia with Gastropexy Feb 2014    Allergies  Allergen Reactions  . Iohexol      Code: HIVES, Desc: reaction after a heart cath per pt--needs 13 hour prep, Onset Date: [id number]     Past Surgical History  Procedure Laterality Date  . Abdominal hysterectomy    . Cholecystectomy    . Carpal tunnel release      bilateral  . Hammer toe surgery      bilateral  . Esophagogastroduodenoscopy  08/12/12  . Appendectomy      with hysterectomy  . Bladder surgery      bladder tacked  . Hiatal hernia repair  12/15/2012    laparascopic repair  . Hiatal hernia repair  12/15/2012    Procedure: LAPAROSCOPIC REPAIR OF HIATAL HERNIA;  Surgeon: Valarie Merino, MD;  Location: [facility];  Service: General;;   [name], MD No diagnosis found.  Doing well.  G tube removed without difficulty.  Refill omeprazole.  Return 2 months Matt B. Daphine Deutscher, MD, [facility] [phone] beeper [phone]  01/26/2013 2:47 PM

## 2013-01-26 NOTE — Patient Instructions (Signed)
Thanks for your patience.  If you need further assistance after leaving the office, please call our office and speak with a [facility] nurse.  [phone].  If you want to leave a message for Dr. Brenten Janney, please call his office phone at [phone]. 

## 2013-02-02 ENCOUNTER — Encounter (INDEPENDENT_AMBULATORY_CARE_PROVIDER_SITE_OTHER): Payer: Self-pay

## 2013-02-03 DIAGNOSIS — J309 Allergic rhinitis, unspecified: Secondary | ICD-10-CM | POA: Diagnosis not present

## 2013-02-10 DIAGNOSIS — J309 Allergic rhinitis, unspecified: Secondary | ICD-10-CM | POA: Diagnosis not present

## 2013-02-17 DIAGNOSIS — J309 Allergic rhinitis, unspecified: Secondary | ICD-10-CM | POA: Diagnosis not present

## 2013-02-25 ENCOUNTER — Ambulatory Visit (INDEPENDENT_AMBULATORY_CARE_PROVIDER_SITE_OTHER): Payer: Medicare Other | Admitting: Neurology

## 2013-02-25 ENCOUNTER — Encounter: Payer: Self-pay | Admitting: Neurology

## 2013-02-25 VITALS — BP 99/57 | HR 78 | Ht 62.0 in | Wt 109.0 lb

## 2013-02-25 DIAGNOSIS — J309 Allergic rhinitis, unspecified: Secondary | ICD-10-CM | POA: Diagnosis not present

## 2013-02-25 DIAGNOSIS — G20 Parkinson's disease: Secondary | ICD-10-CM

## 2013-02-25 DIAGNOSIS — G20A1 Parkinson's disease without dyskinesia, without mention of fluctuations: Secondary | ICD-10-CM

## 2013-02-25 MED ORDER — CARBIDOPA-LEVODOPA-ENTACAPONE 37.5-150-200 MG PO TABS: 1.0000 | ORAL_TABLET | Freq: Four times a day (QID) | ORAL | Status: DC

## 2013-02-25 NOTE — Progress Notes (Signed)
Subjective:    Patient ID: Jill Hamilton is a 79 y.o. female.  HPI  Interim history:  Jill Hamilton is a very pleasant 79 year old right-handed woman who presents for followup consultation of her Parkinson's disease. The patient is accompanied by her husband, Jill Hamilton, today. She previously followed with Jill Hamilton and was last seen by him on 12/14/2012, at which time he felt that she did very well and he continued  her on the same medication regimen. Her falls assessment total score was 18. She was asked to followup in 6 months but presents for sooner than scheduled appointment because she had hiatal hernia the very next day on 12/15/12 and while the surgery was successful and she has been able to eat most solids by now with success in wt gain reported, she has been experiencing worsening of her balance, or stiffness, her gait since the surgery and even has been using a cane which she hasn't used before. She has even fallen one time recently after the surgery when she was taking off a top and was sitting at the side of her bed and slid down and scraped her right shoulder area which is still a little sore from that. She did not hit her head or lost consciousness. She was without her meds for about 3 days at the time of the surgery. But has been taking her medicines regularly since then and still feels she is not quite the same with respect to her Parkinson's symptoms since his surgery. Her current medications are: Xanax as needed, albuterol as needed, Stalevo 151 tablet 3 times a day, and 6:30, known and 4 PM. She is on Celexa, docusate as needed, said he a, Vicodin as needed, niacin, omega-3, Prilosec, Evista. She has an underlying medical history of Parkinson's disease, anxiety, hyperlipidemia, osteoporosis, hiatal hernia, weight loss and lower back pain.   I reviewed Jill Hamilton prior notes and the patient's records and below is a summary of that review:  79 year old right-handed woman with a history  of gait disorder beginning in 2004 and a diagnosis of Parkinson's disease in 2006. She was initially started on carbidopa-levodopa and subsequently Requip was added. She developed dyskinesias which were attempted to be treated with amantadine but she had side effects including livedo reticularis. She is independent in her ADLs and does not use a cane or walker and denies any bowel or bladder incontinence, compulsive behaviors, daytime sleepiness, delusions, memory loss are orthostatic hypotension. She does have swelling in both legs and left knee pain with potential replacement needed. She had arthroscopic surgery in June 2012 on it. She does exercise regularly. She has not had any recent falls. She had carpal tunnel symptoms bilaterally, with release surgery on the right in October 2012. She endorses symptoms of depression. She has chronic lower back pain and has had x-rays as well as a lumbar spine MRI and has undergone 3 epidural injections under Dr. Ethelene Hal. She has a history of hiatal hernia.   Her Past Medical History Is Significant For: Past Medical History  Diagnosis Date  . Hyperlipidemia   . Parkinson's disease   . Diabetes mellitus without complication   . Scoliosis   . Asthma   . Arthritis   . Anxiety     Her Past Surgical History Is Significant For: Past Surgical History  Procedure Laterality Date  . Abdominal hysterectomy    . Cholecystectomy    . Carpal tunnel release      bilateral  . Hammer toe  surgery      bilateral  . Esophagogastroduodenoscopy  08/12/12  . Appendectomy      with hysterectomy  . Bladder surgery      bladder tacked  . Hiatal hernia repair  12/15/2012    laparascopic repair  . Hiatal hernia repair  12/15/2012    Procedure: LAPAROSCOPIC REPAIR OF HIATAL HERNIA;  Surgeon: Jill Hamilton, Jill Hamilton;  Location: [facility];  Service: General;;    Her Family History Is Significant For: Family History  Problem Relation Age of Onset  . Heart disease Father     Her  Social History Is Significant For: History   Social History  . Marital Status: Married    Spouse Name: N/A    Number of Children: N/A  . Years of Education: N/A   Social History Main Topics  . Smoking status: Never Smoker   . Smokeless tobacco: Never Used  . Alcohol Use: No  . Drug Use: No  . Sexually Active: None   Other Topics Concern  . None   Social History Narrative  . None    Her Allergies Are:  Allergies  Allergen Reactions  . Iohexol      Code: HIVES, Desc: reaction after a heart cath per pt--needs 13 hour prep, Onset Date: [id number]   :   Her Current Medications Are:  Outpatient Encounter Prescriptions as of 02/25/2013  Medication Sig Dispense Refill  . albuterol (PROVENTIL HFA;VENTOLIN HFA) 108 (90 BASE) MCG/ACT inhaler Inhale 2 puffs into the lungs every 6 (six) hours as needed. For shortness of breath      . ALPRAZolam (XANAX) 0.5 MG tablet Take 0.25 mg by mouth 2 (two) times daily as needed. For anxiety      . carbidopa-levodopa-entacapone (STALEVO) 37.5-150-200 MG per tablet Take 1 tablet by mouth 3 (three) times daily.      . citalopram (CELEXA) 40 MG tablet Take 40 mg by mouth every morning.       . docusate sodium (COLACE) 100 MG capsule Take 300-400 mg by mouth at bedtime.      Marland Kitchen ezetimibe (ZETIA) 10 MG tablet Take 10 mg by mouth at bedtime.       . fish oil-omega-3 fatty acids 1000 MG capsule Take 3 g by mouth daily.       Marland Kitchen HYDROcodone-acetaminophen (NORCO/VICODIN) 5-325 MG per tablet Take 1 tablet by mouth every 6 (six) hours as needed. For pain      . niacin 100 MG tablet Take 100 mg by mouth daily with breakfast.      . omeprazole (PRILOSEC) 40 MG capsule Take 1 capsule (40 mg total) by mouth every morning.  80 capsule  3  . PREVIDENT 5000 DRY MOUTH 1.1 % PSTE Place 1 application onto teeth 3 (three) times a week.       . raloxifene (EVISTA) 60 MG tablet Take 60 mg by mouth daily.       No facility-administered encounter medications on file as of  02/25/2013.  :   Review of Systems  Constitutional: Positive for fatigue.  HENT: Positive for rhinorrhea and trouble swallowing.   Gastrointestinal: Positive for constipation.  Musculoskeletal: Positive for arthralgias.  Allergic/Immunologic: Positive for environmental allergies.  Neurological: Positive for tremors and weakness.  Psychiatric/Behavioral: Positive for confusion and dysphoric mood. The patient is nervous/anxious.     Objective:  Neurologic Exam  Physical Exam Physical Examination:   Filed Vitals:   02/25/13 1509  BP: 99/57  Pulse: 78    General  Examination: The patient is a very pleasant 79 y.o. female in no acute distress.  HEENT: Normocephalic, atraumatic, pupils are equal, round and reactive to light and accommodation. Funduscopic exam is normal with sharp disc margins noted. Extraocular tracking shows mild saccadic breakdown without nystagmus noted. There is limitation to upper gaze. There is mild decrease in eye blink rate. Hearing is intact. Tympanic membranes are clear bilaterally. Face is symmetric with moderate facial masking and mild facial dyskinesias and normal facial sensation. There is no lip, neck or jaw tremor. Neck is moderately rigid with intact passive ROM. There are no carotid bruits on auscultation. Oropharynx exam reveals mild mouth dryness. No significant airway crowding is noted. Mallampati is class II. Tongue protrudes centrally and palate elevates symmetrically.   There is no drooling.   Chest: is clear to auscultation without wheezing, rhonchi or crackles noted.  Heart: sounds are regular and normal without murmurs, rubs or gallops noted.   Abdomen: is soft, non-tender and non-distended with normal bowel sounds appreciated on auscultation.  Extremities: There is no pitting edema in the distal lower extremities bilaterally.   Skin: is warm and dry with no trophic changes noted.  Musculoskeletal: exam reveals no obvious joint deformities,  tenderness or joint swelling or erythema.  Neurologically:  Mental status: The patient is awake and alert, paying good  attention. She is able to completely provide the history. Her husband provides details. She is oriented to: person, place, time/date, situation, day of week, month of year and year. Her memory, attention, language and knowledge are intact. There is no aphasia, agnosia, apraxia or anomia. There is a mild degree of bradyphrenia. Speech is mildly hypophonic with mild dysarthria noted. Mood is congruent and affect is normal.   Cranial nerves are as described above under HEENT exam. In addition, shoulder shrug is normal with equal shoulder height noted.  Motor exam: Normal bulk, and strength for age is noted. There are mild dyskinesias noted, which are intermittent. Tone is mildly rigid with presence of cogwheeling in the right upper extremity. There is overall moderate bradykinesia. There is no drift or rebound. There is no tremor. Romberg is negative. Reflexes are trace in the upper extremities and trace in the lower extremities. Toes are downgoing bilaterally. Fine motor skills exam reveals: Finger taps are moderately impaired on the right and mildly impaired on the left. Hand movements are moderately impaired on the right and mildly impaired on the left. RAP (rapid alternating patting) is moderately impaired on the right and mildly impaired on the left. Foot taps are moderately impaired on the right and moderately impaired on the left. Foot agility (in the form of heel stomping) is moderately impaired on the right and moderately impaired on the left.    Cerebellar testing shows no dysmetria or intention tremor on finger to nose testing. Heel to shin is unremarkable bilaterally. There is no truncal or gait ataxia.   Sensory exam is intact to light touch, pinprick, vibration, temperature sense and proprioception in the upper and lower extremities.   Gait, station and balance exan: She  stands up from the seated position with moderate difficulty and needs no assistance, but takes 2 attempts . No veering to one side is noted. She is not noted to lean to the side. Posture is moderately stooped. Stance is wide-based. She walks with decrease in stride length and pace and decreased arm swing on the right. She turns in 3 steps. Tandem walk is not possible. Balance is mildly impaired. She  is not able to do a toe or heel stance.     Assessment and Plan:   Assessment and Plan:  In summary, NINI CAVAN is a very pleasant 79 y.o.-year old female with a history of  right-sided predominant Parkinson's disease, and its potential year. She has done well in the past up until recently and has had some worsening of her symptoms since elective hiatal hernia surgery. I had a long chat with the patient and her husband regarding my findings and her complaints and explained to her that a lot of patients with Parkinson's disease often described A. exacerbation of her symptoms when they are going through surgery or some other ailment or illness. I explained to her that it takes the body much longer to recuperate or recover from even elective surgery and that it is not an unusual picture. Nevertheless, I do feel that we can try a increase in her medication at this time and I would like for her to take it every 4 hours starting at 7 AM. Therefore, she will take Stalevo 150 milligrams 4 times a day, namely at 7 AM, 11 AM, 3 PM and 7 PM. She noticed in the past that when she took it late at night she was having trouble sleeping. She goes to bed currently around 11 PM and she should be fine in that regard. I do not think we need to make any adjustments at this time and I asked her to continue using her cane which is a 4 pronged cane. I would like to reevaluate her in a couple of months and see how she is doing and I asked him to call me with any interim questions, concerns, problems, update her refill request. They  were in agreement.

## 2013-02-25 NOTE — Patient Instructions (Addendum)
I think overall you are doing fairly well but I do want to suggest a few things today: Remember, your body may take a while to recover from your recent surgery. Use your cane regularly.   Remember to drink plenty of fluid, eat healthy meals and do not skip any meals. Try to eat protein with a every meal and eat a healthy snack such as fruit or nuts in between meals. Try to keep a regular sleep-wake schedule and try to exercise daily, particularly in the form of walking, 20-30 minutes a day, if you can.   Engage in social activities in your community and with your family and try to keep up with current events by reading the newspaper or watching the news.   As far as your medications are concerned, I would like to suggest an increase in your Stalevo to 4 a day:  Take at 7am, 11am, 3pm and 7pm daily  I would like to see you back in 2 months, sooner if we need to. Please call us with any interim questions, concerns, problems, updates or refill requests.   Brett Canales is my clinical assistant and will answer any of your questions and relay your messages to me and also relay most of my messages to you.  Our phone number is [phone]. We also have an after hours call service for urgent matters and there is a physician on-call for urgent questions. For any emergencies you know to call 911 or go to the nearest emergency room.

## 2013-03-03 DIAGNOSIS — J309 Allergic rhinitis, unspecified: Secondary | ICD-10-CM | POA: Diagnosis not present

## 2013-03-08 DIAGNOSIS — J309 Allergic rhinitis, unspecified: Secondary | ICD-10-CM | POA: Diagnosis not present

## 2013-03-12 DIAGNOSIS — H40019 Open angle with borderline findings, low risk, unspecified eye: Secondary | ICD-10-CM | POA: Diagnosis not present

## 2013-03-12 DIAGNOSIS — H2510 Age-related nuclear cataract, unspecified eye: Secondary | ICD-10-CM | POA: Diagnosis not present

## 2013-03-18 DIAGNOSIS — J309 Allergic rhinitis, unspecified: Secondary | ICD-10-CM | POA: Diagnosis not present

## 2013-03-22 DIAGNOSIS — R634 Abnormal weight loss: Secondary | ICD-10-CM | POA: Diagnosis not present

## 2013-03-22 DIAGNOSIS — R1906 Epigastric swelling, mass or lump: Secondary | ICD-10-CM | POA: Diagnosis not present

## 2013-03-22 DIAGNOSIS — M810 Age-related osteoporosis without current pathological fracture: Secondary | ICD-10-CM | POA: Diagnosis not present

## 2013-03-22 DIAGNOSIS — G20 Parkinson's disease: Secondary | ICD-10-CM | POA: Diagnosis not present

## 2013-03-26 ENCOUNTER — Ambulatory Visit (INDEPENDENT_AMBULATORY_CARE_PROVIDER_SITE_OTHER): Payer: Medicare Other | Admitting: Surgery

## 2013-03-26 ENCOUNTER — Encounter (INDEPENDENT_AMBULATORY_CARE_PROVIDER_SITE_OTHER): Payer: Self-pay | Admitting: Surgery

## 2013-03-26 VITALS — BP 136/72 | HR 76 | Temp 98.1°F | Resp 16 | Ht 62.5 in | Wt 105.0 lb

## 2013-03-26 DIAGNOSIS — Z9889 Other specified postprocedural states: Secondary | ICD-10-CM

## 2013-03-26 DIAGNOSIS — J309 Allergic rhinitis, unspecified: Secondary | ICD-10-CM | POA: Diagnosis not present

## 2013-03-26 DIAGNOSIS — Z8719 Personal history of other diseases of the digestive system: Secondary | ICD-10-CM

## 2013-03-26 NOTE — Patient Instructions (Signed)
Thanks for your patience.  If you need further assistance after leaving the office, please call our office and speak with a [facility] nurse.  [phone].  If you want to leave a message for Dr. Gentri Guardado, please call his office phone at [phone]. 

## 2013-03-26 NOTE — Progress Notes (Signed)
[name] 79 y.o.  Body mass index is 18.89 kg/(m^2).  Patient Active Problem List   Diagnosis Date Noted  . Repair large type III Hiatus hernia with Gastropexy Feb 2014 12/31/2012  . Asthma 09/30/2012  . Parkinson disease 09/30/2012    Allergies  Allergen Reactions  . Iohexol      Code: HIVES, Desc: reaction after a heart cath per pt--needs 13 hour prep, Onset Date: [id number]     Past Surgical History  Procedure Laterality Date  . Abdominal hysterectomy    . Cholecystectomy    . Carpal tunnel release      bilateral  . Hammer toe surgery      bilateral  . Esophagogastroduodenoscopy  08/12/12  . Appendectomy      with hysterectomy  . Bladder surgery      bladder tacked  . Hiatal hernia repair  12/15/2012    laparascopic repair  . Hiatal hernia repair  12/15/2012    Procedure: LAPAROSCOPIC REPAIR OF HIATAL HERNIA;  Surgeon: Valarie Merino, MD;  Location: [facility];  Service: General;;   [name], MD No diagnosis found.  Gastrostomy tube site has healed. Incisions healed. She is feeling much better after her hiatal hernia repair. I will be glad to see her again as needed Matt B. Daphine Deutscher, MD, [facility] [phone] beeper [phone]  03/26/2013 2:00 PM  .

## 2013-03-28 ENCOUNTER — Other Ambulatory Visit: Payer: Self-pay

## 2013-03-28 MED ORDER — CITALOPRAM HYDROBROMIDE 40 MG PO TABS: 40.0000 mg | ORAL_TABLET | Freq: Every morning | ORAL | Status: DC

## 2013-04-01 DIAGNOSIS — J309 Allergic rhinitis, unspecified: Secondary | ICD-10-CM | POA: Diagnosis not present

## 2013-04-06 DIAGNOSIS — I10 Essential (primary) hypertension: Secondary | ICD-10-CM | POA: Diagnosis not present

## 2013-04-06 DIAGNOSIS — H2510 Age-related nuclear cataract, unspecified eye: Secondary | ICD-10-CM | POA: Diagnosis not present

## 2013-04-06 DIAGNOSIS — H269 Unspecified cataract: Secondary | ICD-10-CM | POA: Diagnosis not present

## 2013-04-06 DIAGNOSIS — H259 Unspecified age-related cataract: Secondary | ICD-10-CM | POA: Diagnosis not present

## 2013-04-06 DIAGNOSIS — H2589 Other age-related cataract: Secondary | ICD-10-CM | POA: Diagnosis not present

## 2013-04-09 DIAGNOSIS — J309 Allergic rhinitis, unspecified: Secondary | ICD-10-CM | POA: Diagnosis not present

## 2013-04-15 DIAGNOSIS — J309 Allergic rhinitis, unspecified: Secondary | ICD-10-CM | POA: Diagnosis not present

## 2013-04-20 DIAGNOSIS — H2510 Age-related nuclear cataract, unspecified eye: Secondary | ICD-10-CM | POA: Diagnosis not present

## 2013-04-20 DIAGNOSIS — I10 Essential (primary) hypertension: Secondary | ICD-10-CM | POA: Diagnosis not present

## 2013-04-20 DIAGNOSIS — H269 Unspecified cataract: Secondary | ICD-10-CM | POA: Diagnosis not present

## 2013-04-26 DIAGNOSIS — J309 Allergic rhinitis, unspecified: Secondary | ICD-10-CM | POA: Diagnosis not present

## 2013-05-04 ENCOUNTER — Encounter: Payer: Self-pay | Admitting: Neurology

## 2013-05-04 ENCOUNTER — Ambulatory Visit (INDEPENDENT_AMBULATORY_CARE_PROVIDER_SITE_OTHER): Payer: Medicare Other | Admitting: Neurology

## 2013-05-04 VITALS — BP 128/68 | HR 83 | Temp 97.0°F | Ht 62.0 in | Wt 104.0 lb

## 2013-05-04 DIAGNOSIS — J309 Allergic rhinitis, unspecified: Secondary | ICD-10-CM | POA: Diagnosis not present

## 2013-05-04 DIAGNOSIS — G20 Parkinson's disease: Secondary | ICD-10-CM

## 2013-05-04 DIAGNOSIS — G20A1 Parkinson's disease without dyskinesia, without mention of fluctuations: Secondary | ICD-10-CM

## 2013-05-04 NOTE — Progress Notes (Signed)
Subjective:    Patient ID: Jill Hamilton is a 79 y.o. female.  HPI  Interim history:   Jill Hamilton is a very pleasant 38 role right-handed woman who presents for followup consultation of her Parkinson's disease. I first met her on 02/25/2013 and she previously followed with Dr. Avie Echevaria and was last seen by him on 12/14/2012 at which time he kept her on the same medication regimen. She presented for a sooner appointment rather than her 6 month followup because of flareup in her Parkinson's symptoms after her hiatal hernia surgery on 12/15/2012. She began experiencing worsening balance and stiffness and problems with her gait and has been using a cane since surgery which she hadn't used before. She was diagnosed with Parkinson's disease in 2006 and initially started on carbidopa-levodopa with Requip added. She developed dyskinesias and had side effects with amantadine including livedo reticularis. She has swelling in both legs and left knee pain and needs perhaps a knee replacement. She did have arthroscopic surgery 2 years ago on the same knee. She has lower back pain and had a lumbar spine MRI in the past as well as x-rays and has undergone 3 epidural injections. She has an underlying medical history of hyperlipidemia, diabetes, scoliosis, Parkinson's disease. At the time of her last visit with me I did increase her Stalevo to 4 times a day from 3 times a day, namely at 7, 11, 3 and 7. I also recommended that she continue using her 4 pronged cane for now and recommended of 2 month followup. She noticed improvement with the increase in Stalevo. She has not had to use her cane in the past few weeks. She tries to stay active. She has not fallen. She has not had any need for GI FU and has been doing better and has gained some wt. No SE reported from Stalevo. Her appetite is good. Her mood is stable.   Her Past Medical History Is Significant For: Past Medical History  Diagnosis Date  . Hyperlipidemia    . Parkinson's disease   . Diabetes mellitus without complication   . Scoliosis   . Asthma   . Arthritis   . Anxiety     Her Past Surgical History Is Significant For: Past Surgical History  Procedure Laterality Date  . Abdominal hysterectomy    . Cholecystectomy    . Carpal tunnel release      bilateral  . Hammer toe surgery      bilateral  . Esophagogastroduodenoscopy  08/12/12  . Appendectomy      with hysterectomy  . Bladder surgery      bladder tacked  . Hiatal hernia repair  12/15/2012    laparascopic repair  . Hiatal hernia repair  12/15/2012    Procedure: LAPAROSCOPIC REPAIR OF HIATAL HERNIA;  Surgeon: Valarie Merino, MD;  Location: [facility];  Service: General;;    Her Family History Is Significant For: Family History  Problem Relation Age of Onset  . Heart disease Father     Her Social History Is Significant For: History   Social History  . Marital Status: Married    Spouse Name: N/A    Number of Children: N/A  . Years of Education: N/A   Social History Main Topics  . Smoking status: Never Smoker   . Smokeless tobacco: Never Used  . Alcohol Use: No  . Drug Use: No  . Sexually Active: None   Other Topics Concern  . None   Social History  Narrative  . None    Her Allergies Are:  Allergies  Allergen Reactions  . Iohexol      Code: HIVES, Desc: reaction after a heart cath per pt--needs 13 hour prep, Onset Date: 14782956   :   Her Current Medications Are:  Outpatient Encounter Prescriptions as of 05/04/2013  Medication Sig Dispense Refill  . albuterol (PROVENTIL HFA;VENTOLIN HFA) 108 (90 BASE) MCG/ACT inhaler Inhale 2 puffs into the lungs every 6 (six) hours as needed. For shortness of breath      . ALPRAZolam (XANAX) 0.5 MG tablet Take 0.25 mg by mouth 2 (two) times daily as needed. For anxiety      . amoxicillin (AMOXIL) 500 MG capsule       . carbidopa-levodopa-entacapone (STALEVO) 37.5-150-200 MG per tablet Take 1 tablet by mouth 4 (four) times  daily. Take at 7am, 11am, 3pm and 7pm daily  120 tablet  5  . citalopram (CELEXA) 40 MG tablet Take 1 tablet (40 mg total) by mouth every morning.  90 tablet  1  . docusate sodium (COLACE) 100 MG capsule Take 300-400 mg by mouth at bedtime.      Marland Kitchen ezetimibe (ZETIA) 10 MG tablet Take 10 mg by mouth at bedtime.       . fish oil-omega-3 fatty acids 1000 MG capsule Take 3 g by mouth daily.       Marland Kitchen gentamicin (GARAMYCIN) 0.3 % ophthalmic solution       . HYDROcodone-acetaminophen (NORCO/VICODIN) 5-325 MG per tablet Take 1 tablet by mouth every 6 (six) hours as needed. For pain      . niacin 100 MG tablet Take 100 mg by mouth daily with breakfast.      . omeprazole (PRILOSEC) 40 MG capsule Take 1 capsule (40 mg total) by mouth every morning.  80 capsule  3  . prednisoLONE acetate (PRED FORTE) 1 % ophthalmic suspension       . PREVIDENT 5000 DRY MOUTH 1.1 % PSTE Place 1 application onto teeth 3 (three) times a week.       . raloxifene (EVISTA) 60 MG tablet Take 60 mg by mouth daily.      . traMADol (ULTRAM) 50 MG tablet Take 1 tablet by mouth as needed.       No facility-administered encounter medications on file as of 05/04/2013.   Review of Systems  Constitutional: Positive for fatigue.  HENT: Positive for trouble swallowing.   Eyes: Positive for visual disturbance (recent cataract surgery).  Respiratory: Positive for shortness of breath.   Gastrointestinal: Positive for constipation.  Musculoskeletal: Positive for arthralgias.  Skin: Positive for rash.  Neurological: Positive for dizziness, tremors and weakness.       Memory loss, restless legs  Psychiatric/Behavioral: Positive for confusion and sleep disturbance. The patient is nervous/anxious.     Objective:  Neurologic Exam  Physical Exam Physical Examination:   Filed Vitals:   05/04/13 1501  BP: 128/68  Pulse: 83  Temp: 97 F (36.1 C)    General Examination: The patient is a very pleasant 79 y.o. female in no acute  distress.   HEENT: Normocephalic, atraumatic, pupils are equal, round and reactive to light and accommodation. Funduscopic exam is normal with sharp disc margins noted. Extraocular tracking shows mild saccadic breakdown without nystagmus noted. There is limitation to upper gaze. There is mild decrease in eye blink rate. Hearing is intact. Tympanic membranes are clear bilaterally. Face is symmetric with moderate facial masking and mild facial dyskinesias and normal  facial sensation. There is no lip, neck or jaw tremor. Neck is moderately rigid with intact passive ROM. There are no carotid bruits on auscultation. Oropharynx exam reveals mild mouth dryness. No significant airway crowding is noted. Mallampati is class II. Tongue protrudes centrally and palate elevates symmetrically.   There is no drooling.   Chest: is clear to auscultation without wheezing, rhonchi or crackles noted.  Heart: sounds are regular and normal without murmurs, rubs or gallops noted.   Abdomen: is soft, non-tender and non-distended with normal bowel sounds appreciated on auscultation.  Extremities: There is no pitting edema in the distal lower extremities bilaterally.   Skin: is warm and dry with no trophic changes noted.  Musculoskeletal: exam reveals no obvious joint deformities, tenderness or joint swelling or erythema.  Neurologically:  Mental status: The patient is awake and alert, paying good  attention. She is able to completely provide the history. Her husband provides details. She is oriented to: person, place, time/date, situation, day of week, month of year and year. Her memory, attention, language and knowledge are intact. There is no aphasia, agnosia, apraxia or anomia. There is a mild degree of bradyphrenia. Speech is mildly hypophonic with mild dysarthria noted. Mood is congruent and affect is normal.   Cranial nerves are as described above under HEENT exam. In addition, shoulder shrug is normal with equal  shoulder height noted.  Motor exam: Normal bulk, and strength for age is noted. There are mild dyskinesias noted, which are intermittent. Tone is mildly rigid with presence of cogwheeling in the right upper extremity. There is overall moderate bradykinesia. There is no drift or rebound. There is no tremor. Romberg is negative. Reflexes are trace in the upper extremities and trace in the lower extremities. Toes are downgoing bilaterally. Fine motor skills exam reveals: Finger taps are moderately impaired on the right and mildly impaired on the left. Hand movements are moderately impaired on the right and mildly impaired on the left. RAP (rapid alternating patting) is moderately impaired on the right and mildly impaired on the left. Foot taps are moderately impaired on the right and moderately impaired on the left. Foot agility (in the form of heel stomping) is moderately impaired on the right and moderately impaired on the left.    Cerebellar testing shows no dysmetria or intention tremor on finger to nose testing. Heel to shin is unremarkable bilaterally. There is no truncal or gait ataxia.   Sensory exam is intact to light touch, pinprick, vibration, temperature sense and proprioception in the upper and lower extremities.   Gait, station and balance exan: She stands up from the seated position with moderate difficulty and needs no assistance, but takes 2 attempts . No veering to one side is noted. She is not noted to lean to the side. Posture is moderately stooped. Stance is wide-based. She walks with decrease in stride length and pace and decreased arm swing on the right. She turns in 3 steps. Tandem walk is not possible. Balance is mildly impaired. She is not able to do a toe or heel stance.     Assessment and Plan:   In summary, Jill Hamilton is a very pleasant 79 y.o.-year old female with a history of  right-sided predominant Parkinson's disease, and its 10th year. She has done well in the past  up until recently and has had some worsening of her symptoms since elective hiatal hernia surgery, but has done well since I last saw her in April. I had  a long chat with the patient again today regarding my findings and her complaints and explained to her that staying active mentally and physically is key. She will continue to take Stalevo 150 milligrams 4 times a day, namely at 7 AM, 11 AM, 3 PM and 7 PM. I do not think we need to make any adjustments at this time and I asked her to continue using her cane as needed. I will see her back in 6 months, sooner if the need arises and have asked her to call us with any interim questions, concerns, problems, updates or refill requests. She was in agreement.

## 2013-05-04 NOTE — Patient Instructions (Addendum)
I think overall you are doing fairly well but I do want to suggest a few things today:  Remember to drink plenty of fluid, eat healthy meals and do not skip any meals. Try to eat protein with a every meal and eat a healthy snack such as fruit or nuts in between meals. Try to keep a regular sleep-wake schedule and try to exercise daily, particularly in the form of walking, 20-30 minutes a day, if you can.   Engage in social activities in your community and with your family and try to keep up with current events by reading the newspaper or watching the news.   As far as your medications are concerned, I would like to suggest no changes.    As far as diagnostic testing: no new test.   I would like to see you back in 6 months, sooner if we need to. Please call us with any interim questions, concerns, problems, updates or refill requests.  Brett Canales is my clinical assistant and will answer any of your questions and relay your messages to me and also relay most of my messages to you.  Our phone number is [phone]. We also have an after hours call service for urgent matters and there is a physician on-call for urgent questions. For any emergencies you know to call 911 or go to the nearest emergency room.

## 2013-05-06 ENCOUNTER — Ambulatory Visit: Payer: Medicare Other | Admitting: Neurology

## 2013-05-19 DIAGNOSIS — J3089 Other allergic rhinitis: Secondary | ICD-10-CM | POA: Diagnosis not present

## 2013-05-19 DIAGNOSIS — M810 Age-related osteoporosis without current pathological fracture: Secondary | ICD-10-CM | POA: Diagnosis not present

## 2013-05-19 DIAGNOSIS — J309 Allergic rhinitis, unspecified: Secondary | ICD-10-CM | POA: Diagnosis not present

## 2013-05-19 DIAGNOSIS — J45909 Unspecified asthma, uncomplicated: Secondary | ICD-10-CM | POA: Diagnosis not present

## 2013-05-26 DIAGNOSIS — H35379 Puckering of macula, unspecified eye: Secondary | ICD-10-CM | POA: Diagnosis not present

## 2013-05-26 DIAGNOSIS — Z09 Encounter for follow-up examination after completed treatment for conditions other than malignant neoplasm: Secondary | ICD-10-CM | POA: Diagnosis not present

## 2013-05-26 DIAGNOSIS — H524 Presbyopia: Secondary | ICD-10-CM | POA: Diagnosis not present

## 2013-06-02 DIAGNOSIS — J309 Allergic rhinitis, unspecified: Secondary | ICD-10-CM | POA: Diagnosis not present

## 2013-06-08 ENCOUNTER — Telehealth: Payer: Self-pay | Admitting: Neurology

## 2013-06-08 DIAGNOSIS — G20 Parkinson's disease: Secondary | ICD-10-CM

## 2013-06-09 DIAGNOSIS — J309 Allergic rhinitis, unspecified: Secondary | ICD-10-CM | POA: Diagnosis not present

## 2013-06-09 MED ORDER — ENTACAPONE 200 MG PO TABS: 200.0000 mg | ORAL_TABLET | Freq: Four times a day (QID) | ORAL | Status: DC

## 2013-06-09 MED ORDER — CARBIDOPA-LEVODOPA 25-100 MG PO TABS: 1.5000 | ORAL_TABLET | Freq: Four times a day (QID) | ORAL | Status: DC

## 2013-06-09 NOTE — Telephone Encounter (Signed)
To my knowledge, the patient is not on carbidopa-levodopa but rather on generic Stalevo 150 mg strength 4 times a day. If this is indeed the case we can switch her to generic carbidopa-levodopa and add generic Comtan to it which would amount to the same components as generic Stalevo. Please verify and let me know and we can change the prescriptions.

## 2013-06-09 NOTE — Telephone Encounter (Signed)
I called pt and confirmed that she is on generic Stalevo (was $43 now $130/mo).  Would like the change you suggest.   Please order. Thanks  [facility], on [place].

## 2013-06-09 NOTE — Telephone Encounter (Signed)
Rx for generic Sinemet 25-100 mg strength: 1-1/2 pills 4 times a day and prescription for generic Comtan 200 mg strength one pill 4 times a day [facility] [address] in [place]. This is to replace generic Stalevo due to cost.

## 2013-06-11 ENCOUNTER — Ambulatory Visit: Payer: Self-pay | Admitting: Neurology

## 2013-06-14 ENCOUNTER — Ambulatory Visit: Payer: Self-pay | Admitting: Neurology

## 2013-06-16 DIAGNOSIS — J309 Allergic rhinitis, unspecified: Secondary | ICD-10-CM | POA: Diagnosis not present

## 2013-06-22 DIAGNOSIS — J309 Allergic rhinitis, unspecified: Secondary | ICD-10-CM | POA: Diagnosis not present

## 2013-06-22 DIAGNOSIS — M5137 Other intervertebral disc degeneration, lumbosacral region: Secondary | ICD-10-CM | POA: Diagnosis not present

## 2013-06-23 ENCOUNTER — Telehealth (INDEPENDENT_AMBULATORY_CARE_PROVIDER_SITE_OTHER): Payer: Self-pay | Admitting: *Deleted

## 2013-06-23 DIAGNOSIS — J309 Allergic rhinitis, unspecified: Secondary | ICD-10-CM | POA: Diagnosis not present

## 2013-06-23 NOTE — Telephone Encounter (Signed)
Patient called to state she has been having some new symptoms and was seen yesterday by another MD who suggested she come back to see Dr. Daphine Deutscher as soon as possible.  Appt made for this Friday.  Patient agreeable with the plan at this time.

## 2013-06-25 ENCOUNTER — Ambulatory Visit (INDEPENDENT_AMBULATORY_CARE_PROVIDER_SITE_OTHER): Payer: Medicare Other | Admitting: Surgery

## 2013-06-25 ENCOUNTER — Encounter (INDEPENDENT_AMBULATORY_CARE_PROVIDER_SITE_OTHER): Payer: Self-pay | Admitting: Surgery

## 2013-06-25 VITALS — BP 118/64 | HR 76 | Resp 14 | Ht 62.5 in | Wt 107.2 lb

## 2013-06-25 DIAGNOSIS — Z8719 Personal history of other diseases of the digestive system: Secondary | ICD-10-CM

## 2013-06-25 DIAGNOSIS — Z9889 Other specified postprocedural states: Secondary | ICD-10-CM

## 2013-06-25 NOTE — Patient Instructions (Signed)
Sjgren's Syndrome Sjgren's syndrome is a disease in which the body's natural defense system (immune system) turns against the body's own cells and attacks the body's glands that produce tears and saliva. Sjgren's syndrome is sometimes linked to rheumatic disorders, such as rheumatoid arthritis and lupus. It is 10 times more common in women than in men. Most people with Sjgren's syndrome are from 45 to 77 years of age. CAUSES  The cause is unknown. It runs in families. It is possible that a trigger, such as a viral infection, can set it off. SYMPTOMS  The main symptoms are:  Dry mouth.  Chalky feeling, mouth feels like it is full of cotton.  Difficulty swallowing, speaking, or tasting.  Prone to cavities and mouth infections.  Dry eyes.  Burning, itching, feels like sand in the eyes.  Blurry vision.  Light sensitive. Other symptoms may include:  Skin, nose, and vaginal dryness.  Joint pain, stiffness, and muscle pain. Other organs that may be affected include:  Kidneys.  Blood vessels.  Lungs.  Liver.  Pancreas.  Brain. DIAGNOSIS  Diagnosis is first based on symptoms, medical history, and physical exam. Tests may also be done, including:  Tear production test (Schirmer's test).  A thorough eye exam using a magnifying device (slit-lamp exam).  Test to see the extent of eye damage using dye staining.  Mouth exam to look for signs of salivary gland swelling and mouth dryness.  Removal of a minor salivary gland from inside the lower lip to be studied under a microscope (lip biopsy).  Blood tests. Routine and special blood studies will be checked. Antibodies that attack normal tissue (autoantibodies) may be present, such as antinuclear antibodies (ANAs), rheumatoid factors, and Sjgren's antibodies (anti-SSA and anti-SSB).  Chest X-ray.  Urine tests (urinalysis). TREATMENT  There is no known cure for this syndrome. There is no specific treatment to restore  gland secretion, either. Treatment varies and is generally based upon the problems present.  Moisture replacement therapies may ease the symptoms of dryness.  Nonsteroidal anti-inflammatory drugs (NSAIDs), such as ibuprofen, may be used to treat musculoskeletal symptoms.  Corticosteroids, such as prednisone, may be given for people with severe complications.  Immunosuppressive drugs may be prescribed to control overactivity of the immune system. In severe cases, this overactivity can lead to organ damage.  A surgical procedure called punctal occlusion may be considered. This is done to close the tear ducts, helping to keep more natural tears on the eye's surface. A small percentage of people with Sjgren's syndrome develop lymphoma. If you are worried that you might develop lymphoma, talk to your caregiver to learn more about the disease and the symptoms to watch. HOME CARE INSTRUCTIONS   Eye care:  Use eyedrops as specified by your caregiver.  Try to blink 5 to 6 times per minute.  Protect your eyes from drafts and breezes.  Maintain properly humidified air.  Avoid smoke.  Mouth care:  Sugar-free gum and hard candy can help in some people.  Take frequent sips of water or sugar-free drinks.  Use lip balm, saliva substitutes, and prescription medicines as directed. SEEK MEDICAL CARE IF:   You have an oral temperature above 102 F (38.9 C).  You have night sweats.  You develop constant fatigue.  You have unexplained weight loss.  You develop itchy skin.  You have reddened patches on the skin. FOR MORE INFORMATION  Sjgren's Syndrome Foundation: www.sjogrens.org National Institute of Arthritis and Musculoskeletal and Skin Diseases: www.niams.nih.gov Document Released: 10/18/2002   Document Revised: 01/20/2012 Document Reviewed: 03/05/2010 [facility] Patient Information 2014 [place], [place].    Increase use of Miralax to twice daily.   Use enemas when getting  constipated.   Keep gylcerin suppositories to soften stools if getting hard

## 2013-06-25 NOTE — Progress Notes (Signed)
Jill Hamilton 79 y.o.  Body mass index is 19.28 kg/(m^2).  Patient Active Problem List   Diagnosis Date Noted  . Repair large type III Hiatus hernia with Gastropexy Feb 2014 12/31/2012  . Asthma 09/30/2012  . Parkinson disease 09/30/2012    Allergies  Allergen Reactions  . Iohexol      Code: HIVES, Desc: reaction after a heart cath per pt--needs 13 hour prep, Onset Date: 40981191     Past Surgical History  Procedure Laterality Date  . Abdominal hysterectomy    . Cholecystectomy    . Carpal tunnel release      bilateral  . Hammer toe surgery      bilateral  . Esophagogastroduodenoscopy  08/12/12  . Appendectomy      with hysterectomy  . Bladder surgery      bladder tacked  . Hiatal hernia repair  12/15/2012    laparascopic repair  . Hiatal hernia repair  12/15/2012    Procedure: LAPAROSCOPIC REPAIR OF HIATAL HERNIA;  Surgeon: Valarie Merino, MD;  Location: [facility];  Service: General;;   [name], MD No diagnosis found.  Mr. And Mrs. Quezada came in today in followup. In January a reduced most of her stomach which was in her chest and perform a gastropexy and closure of her hiatal hernia. She does not have a wrap so it should be easy for her to work. Her main complaints today had to do with her mouth being dry particularly in the morning and constipation. I don't think she's getting enough water to drink have encouraged her to try flavored waters but also to take her MiraLAX twice a day to try to stimulate her bowels. She contracted to Parliment candidacy that stimulates some saliva production but underlying at all between her dry and her dry mouth I wondered whether she could have Sjogren syndrome. On her after visit summary I have included some information on that.  From the standpoint of her surgery in January I think she seems to be giving along fairly well. I will be glad to see her on an as-needed basis. Matt B. Daphine Deutscher, MD, [facility] [phone] beeper [phone]  06/25/2013 2:22 PM

## 2013-06-28 DIAGNOSIS — Z1231 Encounter for screening mammogram for malignant neoplasm of breast: Secondary | ICD-10-CM | POA: Diagnosis not present

## 2013-06-28 DIAGNOSIS — J309 Allergic rhinitis, unspecified: Secondary | ICD-10-CM | POA: Diagnosis not present

## 2013-06-30 DIAGNOSIS — L981 Factitial dermatitis: Secondary | ICD-10-CM | POA: Diagnosis not present

## 2013-06-30 DIAGNOSIS — L259 Unspecified contact dermatitis, unspecified cause: Secondary | ICD-10-CM | POA: Diagnosis not present

## 2013-07-08 DIAGNOSIS — J309 Allergic rhinitis, unspecified: Secondary | ICD-10-CM | POA: Diagnosis not present

## 2013-07-13 DIAGNOSIS — J309 Allergic rhinitis, unspecified: Secondary | ICD-10-CM | POA: Diagnosis not present

## 2013-07-22 DIAGNOSIS — J309 Allergic rhinitis, unspecified: Secondary | ICD-10-CM | POA: Diagnosis not present

## 2013-07-28 DIAGNOSIS — J309 Allergic rhinitis, unspecified: Secondary | ICD-10-CM | POA: Diagnosis not present

## 2013-08-03 DIAGNOSIS — J309 Allergic rhinitis, unspecified: Secondary | ICD-10-CM | POA: Diagnosis not present

## 2013-08-10 DIAGNOSIS — J309 Allergic rhinitis, unspecified: Secondary | ICD-10-CM | POA: Diagnosis not present

## 2013-08-25 DIAGNOSIS — J309 Allergic rhinitis, unspecified: Secondary | ICD-10-CM | POA: Diagnosis not present

## 2013-08-27 DIAGNOSIS — J309 Allergic rhinitis, unspecified: Secondary | ICD-10-CM | POA: Diagnosis not present

## 2013-08-27 DIAGNOSIS — Z23 Encounter for immunization: Secondary | ICD-10-CM | POA: Diagnosis not present

## 2013-09-03 DIAGNOSIS — J309 Allergic rhinitis, unspecified: Secondary | ICD-10-CM | POA: Diagnosis not present

## 2013-09-07 DIAGNOSIS — J309 Allergic rhinitis, unspecified: Secondary | ICD-10-CM | POA: Diagnosis not present

## 2013-09-11 ENCOUNTER — Other Ambulatory Visit: Payer: Self-pay | Admitting: Neurology

## 2013-09-14 NOTE — Telephone Encounter (Signed)
Per last OV note:

## 2013-09-16 DIAGNOSIS — J309 Allergic rhinitis, unspecified: Secondary | ICD-10-CM | POA: Diagnosis not present

## 2013-09-21 DIAGNOSIS — H547 Unspecified visual loss: Secondary | ICD-10-CM | POA: Diagnosis not present

## 2013-09-23 DIAGNOSIS — J309 Allergic rhinitis, unspecified: Secondary | ICD-10-CM | POA: Diagnosis not present

## 2013-09-30 DIAGNOSIS — J309 Allergic rhinitis, unspecified: Secondary | ICD-10-CM | POA: Diagnosis not present

## 2013-10-01 DIAGNOSIS — H26499 Other secondary cataract, unspecified eye: Secondary | ICD-10-CM | POA: Diagnosis not present

## 2013-10-13 DIAGNOSIS — M5137 Other intervertebral disc degeneration, lumbosacral region: Secondary | ICD-10-CM | POA: Diagnosis not present

## 2013-10-13 DIAGNOSIS — J309 Allergic rhinitis, unspecified: Secondary | ICD-10-CM | POA: Diagnosis not present

## 2013-10-14 ENCOUNTER — Other Ambulatory Visit: Payer: Self-pay | Admitting: Neurology

## 2013-10-15 NOTE — Telephone Encounter (Signed)
Former [facility] patient assigned to Dr Frances Furbish.  Dr Sandria Manly has been prescribing this medication for the patient for quite some time.

## 2013-10-18 DIAGNOSIS — J309 Allergic rhinitis, unspecified: Secondary | ICD-10-CM | POA: Diagnosis not present

## 2013-10-20 ENCOUNTER — Ambulatory Visit (INDEPENDENT_AMBULATORY_CARE_PROVIDER_SITE_OTHER): Payer: Medicare Other

## 2013-10-20 VITALS — BP 117/74 | HR 88 | Resp 16

## 2013-10-20 DIAGNOSIS — M2040 Other hammer toe(s) (acquired), unspecified foot: Secondary | ICD-10-CM

## 2013-10-20 DIAGNOSIS — M79609 Pain in unspecified limb: Secondary | ICD-10-CM | POA: Diagnosis not present

## 2013-10-20 DIAGNOSIS — Q828 Other specified congenital malformations of skin: Secondary | ICD-10-CM

## 2013-10-20 NOTE — Patient Instructions (Signed)

## 2013-10-20 NOTE — Progress Notes (Signed)
° °  Subjective:    Patient ID: Jill Hamilton, female    DOB: 1934/07/23, 79 y.o.   MRN: [id number]  HPI Dr. Leeanne Deed did surgery on both my feet 10 years ago and now I have more hammertoes and my feet hurt and have been hurting around fall of this year and hurts with closed toe shoes and sore and tender and corns also    Review of Systems  Constitutional: Negative.   HENT: Negative.   Respiratory: Negative.   Cardiovascular: Negative.   Gastrointestinal: Negative.   Endocrine: Negative.   Musculoskeletal: Positive for arthralgias.  Skin: Negative.   Allergic/Immunologic: Negative.   Neurological: Positive for tremors.  Hematological: Negative.   Psychiatric/Behavioral: Negative.   All other systems reviewed and are negative.       Objective:   Physical Exam Neurovascular status is intact as follows pedal pulses palpable DP +2/4 PT plus one over 4 bilateral. Capillary fill time 3 seconds all digits. There is mild varicosities noted bilateral. Skin temperature warm turgor normal no edema noted. Neurologically epicritic and proprioceptive sensations appear to be intact and symmetric bilateral. Patient does have some tremors-or spasming of toes secondary to her Parkinson's symptoms. Muscle strengths appear to be normal the patient does have some contractures. Orthopedic biomechanical exam reveals rectus foot type has had previous surgery and hallux and second third toes bilateral our fourth and fifth no are dorsally displaced with semirigid digital contractures at the IP joint and MTP joints. Patient is has mild arthropathy the forefoot and toes. X-rays reveal hammertoe deformities 4 and 5 there is some residual deformity third toe right and left.       Assessment & Plan:  Assessment this time patient does have hammertoe deformities bilateral fourth and fifth digit right significant worse than left with associated keratoses being noted at this time dispensed and tubercle padding cushion  the toes consider that conservative care and maintain accommodative shoe care and shoewear are in the interim. Patient is incision surgical correction however possibly the next 3-4 months will return for surgical consult for possible hammertoe repair fourth and fifth digit pinning of the fourth digit and derotational arthroplasty fifth of the right foot. This can be done under IV sedation and local and sunblock it at day surgery [facility] patient given literature about hammertoe repair which she's had done 10 years ago. Overall her health appears to be relatively stable patient came she did have surgery earlier this year a hiatal hernia repair was done without complications. Again followup for surgical consult in the spring of 2015  Alvan Dame [facility]

## 2013-10-27 DIAGNOSIS — J309 Allergic rhinitis, unspecified: Secondary | ICD-10-CM | POA: Diagnosis not present

## 2013-11-01 DIAGNOSIS — J309 Allergic rhinitis, unspecified: Secondary | ICD-10-CM | POA: Diagnosis not present

## 2013-11-10 DIAGNOSIS — J309 Allergic rhinitis, unspecified: Secondary | ICD-10-CM | POA: Diagnosis not present

## 2013-11-10 DIAGNOSIS — E7800 Pure hypercholesterolemia, unspecified: Secondary | ICD-10-CM | POA: Diagnosis not present

## 2013-11-10 DIAGNOSIS — M810 Age-related osteoporosis without current pathological fracture: Secondary | ICD-10-CM | POA: Diagnosis not present

## 2013-11-15 ENCOUNTER — Ambulatory Visit (INDEPENDENT_AMBULATORY_CARE_PROVIDER_SITE_OTHER): Payer: Medicare Other | Admitting: Neurology

## 2013-11-15 ENCOUNTER — Encounter: Payer: Self-pay | Admitting: Neurology

## 2013-11-15 VITALS — BP 133/75 | HR 79 | Temp 97.9°F | Ht 62.0 in | Wt 104.0 lb

## 2013-11-15 DIAGNOSIS — F411 Generalized anxiety disorder: Secondary | ICD-10-CM | POA: Diagnosis not present

## 2013-11-15 DIAGNOSIS — J309 Allergic rhinitis, unspecified: Secondary | ICD-10-CM | POA: Diagnosis not present

## 2013-11-15 DIAGNOSIS — G20 Parkinson's disease: Secondary | ICD-10-CM | POA: Diagnosis not present

## 2013-11-15 MED ORDER — ALPRAZOLAM 0.5 MG PO TABS: 0.2500 mg | ORAL_TABLET | Freq: Two times a day (BID) | ORAL | Status: DC | PRN

## 2013-11-15 MED ORDER — CARBIDOPA-LEVODOPA-ENTACAPONE 37.5-150-200 MG PO TABS: 1.0000 | ORAL_TABLET | Freq: Four times a day (QID) | ORAL | Status: DC

## 2013-11-15 NOTE — Progress Notes (Signed)
Subjective:    Patient ID: Jill Hamilton is a 80 y.o. female.  HPI  Interim history:   Jill Hamilton is a very pleasant 80 year-old right-handed woman who presents for followup consultation of her right-sided predominant Parkinson's disease of approximately ten-year duration. She is accompanied by her husband today. I last saw her on 05/04/2013, and which time I continued her on Stalevo 150 mg 4 times a day. However, in July she called back reporting an increase in price of generic Stalevo and therefore I switched her to a generic Sinemet and added Comtan, but the combination was even more expensive, so she switched back to generic Stalevo. Today, she reports having fallen one time in the last 6 months, she fell in the kitchen, but did not injure herself, but bruised her left hip. She is off of Evista and is on monthly shots for her osteoporosis. She feels stable overall and takes Miralax qod for constipation. She has mild forgetfulness, but no depression. She drives locally and not at night.  I first met her on 02/25/2013 and she previously followed with Dr. Morene Antu and was last seen by him on 12/14/2012 at which time he kept her on the same medication regimen. She presented for a sooner appointment rather than her 6 month followup because of flareup in her Parkinson's symptoms after her hiatal hernia surgery on 12/15/2012. She began experiencing worsening balance and stiffness and problems with her gait and started using a cane since her surgery which she had not used before.  She was diagnosed with Parkinson's disease in 2006 and initially started on carbidopa-levodopa with Requip added. She developed dyskinesias and had side effects with amantadine including livedo reticularis. She has swelling in both legs and left knee pain and needs perhaps a knee replacement. She did have arthroscopic surgery 2 years ago on the same knee. She has lower back pain and had a lumbar spine MRI in the past as well  as x-rays and has undergone 3 epidural injections.  She has an underlying medical history of hyperlipidemia, diabetes, scoliosis, Parkinson's disease. I increased her Stalevo to 4 times a day from 3 times a day in 4/14, at 7, 11, 3 and 7. I also recommended that she continue using her 4 pronged cane and recommended of 2 month followup.  She noticed improvement with the increase in Stalevo. She stopped using her cane.   Her Past Medical History Is Significant For: Past Medical History  Diagnosis Date  . Hyperlipidemia   . Parkinson's disease   . Diabetes mellitus without complication   . Scoliosis   . Asthma   . Arthritis   . Anxiety     Her Past Surgical History Is Significant For: Past Surgical History  Procedure Laterality Date  . Abdominal hysterectomy    . Cholecystectomy    . Carpal tunnel release      bilateral  . Hammer toe surgery      bilateral  . Esophagogastroduodenoscopy  08/12/12  . Appendectomy      with hysterectomy  . Bladder surgery      bladder tacked  . Hiatal hernia repair  12/15/2012    laparascopic repair  . Hiatal hernia repair  12/15/2012    Procedure: LAPAROSCOPIC REPAIR OF HIATAL HERNIA;  Surgeon: [name], MD;  Location: [facility];  Service: General;;   Her Family History Is Significant For: Family History  Problem Relation Age of Onset  . Heart disease Father  Her Social History Is Significant For: History   Social History  . Marital Status: Married    Spouse Name: N/A    Number of Children: N/A  . Years of Education: N/A   Social History Main Topics  . Smoking status: Never Smoker   . Smokeless tobacco: Never Used  . Alcohol Use: No  . Drug Use: No  . Sexual Activity: None   Other Topics Concern  . None   Social History Narrative  . None    Her Allergies Are:  Allergies  Allergen Reactions  . Iodine   . Iohexol      Code: HIVES, Desc: reaction after a heart cath per pt--needs 13 hour prep, Onset Date: [id number]   :    Her Current Medications Are:  Outpatient Encounter Prescriptions as of 11/15/2013  Medication Sig  . albuterol (PROVENTIL HFA;VENTOLIN HFA) 108 (90 BASE) MCG/ACT inhaler Inhale 2 puffs into the lungs every 6 (six) hours as needed. For shortness of breath  . ALPRAZolam (XANAX) 0.5 MG tablet Take 0.25 mg by mouth 2 (two) times daily as needed. For anxiety  . amoxicillin (AMOXIL) 500 MG capsule   . carbidopa-levodopa-entacapone (STALEVO) 37.5-150-200 MG per tablet TAKE 1 TABLET BY MOUTH 4 (FOUR) TIMES DAILY. TAKE AT 7AM, 11AM, 3PM AND 7PM DAILY  . citalopram (CELEXA) 40 MG tablet TAKE 1 TABLET BY MOUTH IN THE MORNING  . ezetimibe (ZETIA) 10 MG tablet Take 10 mg by mouth at bedtime.   . fish oil-omega-3 fatty acids 1000 MG capsule Take 3 g by mouth daily.   Marland Kitchen HYDROcodone-acetaminophen (NORCO/VICODIN) 5-325 MG per tablet Take 1 tablet by mouth every 6 (six) hours as needed. For pain  . niacin 100 MG tablet Take 100 mg by mouth daily with breakfast.  . omeprazole (PRILOSEC) 40 MG capsule Take 1 capsule (40 mg total) by mouth every morning.  . prednisoLONE acetate (PRED FORTE) 1 % ophthalmic suspension   . PREVIDENT 5000 DRY MOUTH 1.1 % PSTE Place 1 application onto teeth 3 (three) times a week.   . traMADol (ULTRAM) 50 MG tablet Take 1 tablet by mouth as needed.  . [DISCONTINUED] carbidopa-levodopa (SINEMET IR) 25-100 MG per tablet Take 1.5 tablets by mouth 4 (four) times daily. At 7, 11, 3PM, 7PM. In lieu of generic Stalevo  . [DISCONTINUED] docusate sodium (COLACE) 100 MG capsule Take 300-400 mg by mouth at bedtime.  . [DISCONTINUED] entacapone (COMTAN) 200 MG tablet Take 1 tablet (200 mg total) by mouth 4 (four) times daily. At 7, 11, 3PM, and 7PM. In lieu of generic Stalevo  . [DISCONTINUED] gentamicin (GARAMYCIN) 0.3 % ophthalmic solution   . [DISCONTINUED] raloxifene (EVISTA) 60 MG tablet Take 60 mg by mouth daily.  :  Review of Systems:  Out of a complete 14 point review of systems, all  are reviewed and negative with the exception of these symptoms as listed below:   Review of Systems  Constitutional: Positive for fatigue and unexpected weight change.  HENT: Positive for trouble swallowing.   Eyes: Positive for photophobia and redness.  Respiratory: Positive for cough.   Cardiovascular: Negative.   Gastrointestinal: Negative.   Endocrine: Negative.   Genitourinary: Negative.   Musculoskeletal: Positive for back pain, gait problem and neck stiffness.  Skin: Negative.   Allergic/Immunologic: Negative.   Neurological: Positive for weakness.  Hematological: Negative.   Psychiatric/Behavioral: Positive for sleep disturbance (frequent waking). The patient is nervous/anxious.     Objective:  Neurologic Exam  Physical Exam Physical  Examination:   Filed Vitals:   11/15/13 1427  BP: 133/75  Pulse: 79  Temp: 97.9 F (36.6 C)   General Examination: The patient is a very pleasant 80 y.o. female in no acute distress.  HEENT: Normocephalic, atraumatic, pupils are equal, round and reactive to light and accommodation. Funduscopic exam is normal with sharp disc margins noted. Extraocular tracking shows mild saccadic breakdown without nystagmus noted. There is limitation to upper gaze. There is mild decrease in eye blink rate. Hearing is intact.Face is symmetric with moderate facial masking and mild facial dyskinesias and normal facial sensation. There is no lip, neck or jaw tremor. Neck is moderately rigid with intact passive ROM. There are no carotid bruits on auscultation. Oropharynx exam reveals mild mouth dryness. No significant airway crowding is noted. Mallampati is class II. Tongue protrudes centrally and palate elevates symmetrically.   There is no drooling.   Chest: is clear to auscultation without wheezing, rhonchi or crackles noted.  Heart: sounds are regular and normal without murmurs, rubs or gallops noted.   Abdomen: is soft, non-tender and non-distended with  normal bowel sounds appreciated on auscultation.  Extremities: There is no pitting edema in the distal lower extremities bilaterally.   Skin: is warm and dry with no trophic changes noted.  Musculoskeletal: exam reveals ulnar deviation and joint deformities in keeping with OA in her hands.   Neurologically:  Mental status: The patient is awake and alert, paying good attention. She is able to completely provide the history. Her husband provides details. She is oriented to: person, place, time/date, situation, day of week, month of year and year. Her memory, attention, language and knowledge are intact. There is no aphasia, agnosia, apraxia or anomia. There is a mild degree of bradyphrenia. Speech is mildly hypophonic with mild dysarthria noted. Mood is congruent and affect is normal.   Cranial nerves are as described above under HEENT exam. In addition, shoulder shrug is normal with equal shoulder height noted.  Motor exam: thin bulk, and normal strength for age is noted. There are mild dyskinesias noted, which are intermittent. Tone is mildly rigid with presence of cogwheeling in the right upper extremity. There is overall moderate bradykinesia. There is no drift or rebound. There is no tremor. Romberg is negative. Reflexes are trace in the upper extremities and trace in the lower extremities. Fine motor skills exam reveals: Finger taps are moderately impaired on the right and mildly impaired on the left. Hand movements are moderately impaired on the right and mildly impaired on the left. RAP (rapid alternating patting) is moderately impaired on the right and mildly impaired on the left. Foot taps are moderately impaired on the right and moderately impaired on the left. Foot agility (in the form of heel stomping) is moderately impaired on the right and moderately impaired on the left.    Cerebellar testing shows no dysmetria or intention tremor on finger to nose testing. Heel to shin is unremarkable  bilaterally. There is no truncal or gait ataxia.   Sensory exam is intact to light touch, pinprick, vibration, temperature sense in the upper and lower extremities.   Gait, station and balance exan: She stands up from the seated position with moderate difficulty and needs no assistance, but takes 2 attempts and pushes herself up. No veering to one side is noted. She is not noted to lean to the side. Posture is moderately stooped. Stance is wide-based. She walks with decrease in stride length and pace and decreased arm swing  on the right. She turns in 3 steps. Tandem walk is not possible. Balance is mildly impaired. She is not able to do a toe or heel stance.    Assessment and Plan:   In summary, Jill Hamilton is a very pleasant 80 year old female with a history of right-sided predominant Parkinson's disease, in its 10th year. She has done well in the past with a set back in her PD symptoms after her hiatal hernia surgery, but has done well since April 2014. I had a long chat with the patient and her husband again today regarding my findings and her complaints and explained to her that staying active mentally and physically is key. I think she has remained stable in the last 6 months. She had tried Requip in the past but developed lower chimney swelling. She had side effects with amantadine in the past. I would like to continue with the same dose of Stalevo generic 150 milligrams 4 times a day, at 7 AM, 11 AM, 3 PM and 7 PM. I do not think we need to make any adjustments at this time and I asked her to continue using her cane as needed. I will see her back in 4 months, sooner if the need arises and have asked her to call us with any interim questions, concerns, problems, updates or refill requests. They were in agreement. I renewed her prescription for Xanax 0.5 mg half a pill twice daily as needed as well as her prescription for Stalevo today. Most of my 25 minute visit today was spent in counseling and  coordination of care, reviewing test results and reviewing medication.

## 2013-11-15 NOTE — Patient Instructions (Addendum)
I think your Parkinson's disease has remained fairly stable, which is reassuring. Nevertheless, as you know, this disease does progress with time. It can affect your balance, your memory, your mood, your bowel and bladder function, your posture, balance and walking. Overall you are doing fairly well but I do want to suggest a few things today:  Remember to drink plenty of fluid, eat healthy meals and do not skip any meals. Try to eat protein with a every meal and eat a healthy snack such as fruit or nuts in between meals. Try to keep a regular sleep-wake schedule and try to exercise daily, particularly in the form of walking, 20-30 minutes a day, if you can.   Taking your medication on schedule is key.   Try to stay active physically and mentally. Engage in social activities in your community and with your family and try to keep up with current events by reading the newspaper or watching the news. Try to do word puzzles and you may like to do word puzzles and brain games on the computer such as on [email or web address]   As far as your medications are concerned, I would like to suggest that you take your current medication with the following additional changes: none today.    As far as diagnostic testing, I will order: no new test.    I would like to see you back in 4 months, sooner if we need to. Please call us with any interim questions, concerns, problems, updates or refill requests.  Our nursing staff will answer any of your questions and relay your messages to me and also relay most of my messages to you.  Our phone number is [phone]. We also have an after hours call service for urgent matters and there is a physician on-call for urgent questions, that cannot wait till the next work day. For any emergencies you know to call 911 or go to the nearest emergency room.

## 2013-11-17 DIAGNOSIS — K219 Gastro-esophageal reflux disease without esophagitis: Secondary | ICD-10-CM | POA: Diagnosis not present

## 2013-11-17 DIAGNOSIS — Z0000 Encounter for general adult medical examination without abnormal findings: Secondary | ICD-10-CM | POA: Diagnosis not present

## 2013-11-17 DIAGNOSIS — Z1331 Encounter for screening for depression: Secondary | ICD-10-CM | POA: Diagnosis not present

## 2013-11-17 DIAGNOSIS — J309 Allergic rhinitis, unspecified: Secondary | ICD-10-CM | POA: Diagnosis not present

## 2013-11-17 DIAGNOSIS — R1906 Epigastric swelling, mass or lump: Secondary | ICD-10-CM | POA: Diagnosis not present

## 2013-11-17 DIAGNOSIS — J45909 Unspecified asthma, uncomplicated: Secondary | ICD-10-CM | POA: Diagnosis not present

## 2013-11-17 DIAGNOSIS — F4321 Adjustment disorder with depressed mood: Secondary | ICD-10-CM | POA: Diagnosis not present

## 2013-11-17 DIAGNOSIS — G20 Parkinson's disease: Secondary | ICD-10-CM | POA: Diagnosis not present

## 2013-11-17 DIAGNOSIS — K2270 Barrett's esophagus without dysplasia: Secondary | ICD-10-CM | POA: Diagnosis not present

## 2013-11-18 DIAGNOSIS — Z1212 Encounter for screening for malignant neoplasm of rectum: Secondary | ICD-10-CM | POA: Diagnosis not present

## 2013-11-23 DIAGNOSIS — J309 Allergic rhinitis, unspecified: Secondary | ICD-10-CM | POA: Diagnosis not present

## 2013-11-29 DIAGNOSIS — H40019 Open angle with borderline findings, low risk, unspecified eye: Secondary | ICD-10-CM | POA: Diagnosis not present

## 2013-11-29 DIAGNOSIS — H26499 Other secondary cataract, unspecified eye: Secondary | ICD-10-CM | POA: Diagnosis not present

## 2013-12-01 DIAGNOSIS — M810 Age-related osteoporosis without current pathological fracture: Secondary | ICD-10-CM | POA: Diagnosis not present

## 2013-12-01 DIAGNOSIS — J309 Allergic rhinitis, unspecified: Secondary | ICD-10-CM | POA: Diagnosis not present

## 2013-12-01 DIAGNOSIS — H1045 Other chronic allergic conjunctivitis: Secondary | ICD-10-CM | POA: Diagnosis not present

## 2013-12-01 DIAGNOSIS — J45909 Unspecified asthma, uncomplicated: Secondary | ICD-10-CM | POA: Diagnosis not present

## 2013-12-01 DIAGNOSIS — J3089 Other allergic rhinitis: Secondary | ICD-10-CM | POA: Diagnosis not present

## 2013-12-01 DIAGNOSIS — Z681 Body mass index (BMI) 19 or less, adult: Secondary | ICD-10-CM | POA: Diagnosis not present

## 2013-12-03 DIAGNOSIS — J309 Allergic rhinitis, unspecified: Secondary | ICD-10-CM | POA: Diagnosis not present

## 2013-12-06 ENCOUNTER — Telehealth: Payer: Self-pay | Admitting: *Deleted

## 2013-12-06 NOTE — Telephone Encounter (Signed)
All requested info has been sent to ins. Pending their response.  They will notify the patient of the outcome.

## 2013-12-08 DIAGNOSIS — J309 Allergic rhinitis, unspecified: Secondary | ICD-10-CM | POA: Diagnosis not present

## 2013-12-15 ENCOUNTER — Ambulatory Visit: Payer: Medicare Other

## 2013-12-16 DIAGNOSIS — J309 Allergic rhinitis, unspecified: Secondary | ICD-10-CM | POA: Diagnosis not present

## 2013-12-23 DIAGNOSIS — J309 Allergic rhinitis, unspecified: Secondary | ICD-10-CM | POA: Diagnosis not present

## 2013-12-30 DIAGNOSIS — J309 Allergic rhinitis, unspecified: Secondary | ICD-10-CM | POA: Diagnosis not present

## 2014-01-05 DIAGNOSIS — J309 Allergic rhinitis, unspecified: Secondary | ICD-10-CM | POA: Diagnosis not present

## 2014-01-06 ENCOUNTER — Ambulatory Visit: Payer: Medicare Other

## 2014-01-10 DIAGNOSIS — J019 Acute sinusitis, unspecified: Secondary | ICD-10-CM | POA: Diagnosis not present

## 2014-01-10 DIAGNOSIS — S0190XA Unspecified open wound of unspecified part of head, initial encounter: Secondary | ICD-10-CM | POA: Diagnosis not present

## 2014-01-10 DIAGNOSIS — IMO0002 Reserved for concepts with insufficient information to code with codable children: Secondary | ICD-10-CM | POA: Diagnosis not present

## 2014-01-10 DIAGNOSIS — S0100XA Unspecified open wound of scalp, initial encounter: Secondary | ICD-10-CM | POA: Diagnosis not present

## 2014-01-17 DIAGNOSIS — J309 Allergic rhinitis, unspecified: Secondary | ICD-10-CM | POA: Diagnosis not present

## 2014-01-26 DIAGNOSIS — J309 Allergic rhinitis, unspecified: Secondary | ICD-10-CM | POA: Diagnosis not present

## 2014-02-01 DIAGNOSIS — J309 Allergic rhinitis, unspecified: Secondary | ICD-10-CM | POA: Diagnosis not present

## 2014-02-08 DIAGNOSIS — J309 Allergic rhinitis, unspecified: Secondary | ICD-10-CM | POA: Diagnosis not present

## 2014-02-14 DIAGNOSIS — J309 Allergic rhinitis, unspecified: Secondary | ICD-10-CM | POA: Diagnosis not present

## 2014-02-21 DIAGNOSIS — J309 Allergic rhinitis, unspecified: Secondary | ICD-10-CM | POA: Diagnosis not present

## 2014-02-24 DIAGNOSIS — S0990XA Unspecified injury of head, initial encounter: Secondary | ICD-10-CM | POA: Diagnosis not present

## 2014-02-24 DIAGNOSIS — S0100XA Unspecified open wound of scalp, initial encounter: Secondary | ICD-10-CM | POA: Diagnosis not present

## 2014-02-24 DIAGNOSIS — R51 Headache: Secondary | ICD-10-CM | POA: Diagnosis not present

## 2014-02-24 DIAGNOSIS — G20 Parkinson's disease: Secondary | ICD-10-CM | POA: Diagnosis not present

## 2014-02-24 DIAGNOSIS — Z79899 Other long term (current) drug therapy: Secondary | ICD-10-CM | POA: Diagnosis not present

## 2014-02-24 DIAGNOSIS — R296 Repeated falls: Secondary | ICD-10-CM | POA: Diagnosis not present

## 2014-02-25 DIAGNOSIS — S0990XA Unspecified injury of head, initial encounter: Secondary | ICD-10-CM | POA: Diagnosis not present

## 2014-02-28 DIAGNOSIS — J309 Allergic rhinitis, unspecified: Secondary | ICD-10-CM | POA: Diagnosis not present

## 2014-03-08 DIAGNOSIS — J309 Allergic rhinitis, unspecified: Secondary | ICD-10-CM | POA: Diagnosis not present

## 2014-03-17 DIAGNOSIS — J309 Allergic rhinitis, unspecified: Secondary | ICD-10-CM | POA: Diagnosis not present

## 2014-03-22 DIAGNOSIS — J309 Allergic rhinitis, unspecified: Secondary | ICD-10-CM | POA: Diagnosis not present

## 2014-03-29 ENCOUNTER — Encounter: Payer: Self-pay | Admitting: Neurology

## 2014-03-29 ENCOUNTER — Ambulatory Visit (INDEPENDENT_AMBULATORY_CARE_PROVIDER_SITE_OTHER): Payer: Medicare Other | Admitting: Neurology

## 2014-03-29 VITALS — BP 106/62 | HR 78 | Temp 97.2°F | Ht 62.0 in | Wt 104.0 lb

## 2014-03-29 DIAGNOSIS — R296 Repeated falls: Secondary | ICD-10-CM

## 2014-03-29 DIAGNOSIS — G20 Parkinson's disease: Secondary | ICD-10-CM

## 2014-03-29 DIAGNOSIS — Z9181 History of falling: Secondary | ICD-10-CM | POA: Diagnosis not present

## 2014-03-29 DIAGNOSIS — J309 Allergic rhinitis, unspecified: Secondary | ICD-10-CM | POA: Diagnosis not present

## 2014-03-29 NOTE — Patient Instructions (Signed)
We will not change your medication as yet.  We will consider physical therapy down the road.  Don't overdo it! Drink more water!  Use your cane or walker at all times.

## 2014-03-29 NOTE — Progress Notes (Signed)
Subjective:    Patient ID: Jill Hamilton is a 80 y.o. female.  HPI    Interim history:   Jill Hamilton is a very pleasant 80 year-old right-handed woman with an underlying medical history of hyperlipidemia, type 2 diabetes, scoliosis, asthma, arthritis, chronic LBP on as needed hydrocodone, per Dr. Nelva Bush, and anxiety, who presents for followup consultation of her right-sided predominant Parkinson's disease of approximately 9-year duration. She is accompanied by her husband today. I saw her on 11/15/2013, at which time I suggested she use her cane as needed. I did not change her medications as I felt she was fairly stable. She reported a recent fall with bruised her hip and did not injure herself. She endorses some memory loss and no depression and mild constipation treated with MiraLax every other day.   Today, she reports, that she has fallen about 4-5 times during her move from their single story home to independent living in [place], at [facility] and they have been there since 03/05/14 and they like their new place. They have their home of 46 years on sale. She fell in April and hit her head, needed stiches, and she again fell earlier this month and hit her head and needed stiches. Both times, she was taken to [facility] and had [facility] and this was negative per her and her husband. She has been using her 4 prong cane consistently, she has a 4 wheeled walker with a seat. She has had some lack of energy, but has also pushed herself.   I saw her on 05/04/2013, at which time I continued her on Stalevo 150 mg 4 times a day. However, in July she called back reporting an increase in price of generic Stalevo and therefore I switched her to a generic Sinemet and added Comtan, but the combination was even more expensive, so she switched back to generic Stalevo.   I first met her on 02/25/2013 and she previously followed with Dr. Morene Antu and was last seen by him on 12/14/2012 at which time he kept  her on the same medication regimen. She presented for a sooner appointment rather than her 6 month followup because of flareup in her Parkinson's symptoms after her hiatal hernia surgery on 12/15/2012. She began experiencing worsening balance and stiffness and problems with her gait and started using a cane since her surgery which she had not used before.  She was diagnosed with Parkinson's disease in 2006 and initially started on carbidopa-levodopa with Requip added. She developed dyskinesias and had side effects with amantadine including livedo reticularis. She has swelling in both legs and left knee pain and needs perhaps a knee replacement. She had arthroscopic surgery some 3 years ago on the same knee. She has lower back pain and had a lumbar spine MRI in the past as well as x-rays and has undergone 3 epidural injections.  I increased her Stalevo to 4 times a day from 3 times a day in 4/14, at 7, 11, 3 and 7. I also recommended that she continue using her 4 pronged cane and recommended of 2 month followup. She noticed improvement with the increase in Stalevo and stopped using her cane.   Her Past Medical History Is Significant For: Past Medical History  Diagnosis Date  . Hyperlipidemia   . Parkinson's disease   . Diabetes mellitus without complication   . Scoliosis   . Asthma   . Arthritis   . Anxiety     Her Past Surgical History Is  Significant For: Past Surgical History  Procedure Laterality Date  . Abdominal hysterectomy    . Cholecystectomy    . Carpal tunnel release      bilateral  . Hammer toe surgery      bilateral  . Esophagogastroduodenoscopy  08/12/12  . Appendectomy      with hysterectomy  . Bladder surgery      bladder tacked  . Hiatal hernia repair  12/15/2012    laparascopic repair  . Hiatal hernia repair  12/15/2012    Procedure: LAPAROSCOPIC REPAIR OF HIATAL HERNIA;  Surgeon: [name], MD;  Location: [facility];  Service: General;;    Her Family History Is  Significant For: Family History  Problem Relation Age of Onset  . Heart disease Father     Her Social History Is Significant For: History   Social History  . Marital Status: Married    Spouse Name: N/A    Number of Children: N/A  . Years of Education: N/A   Social History Main Topics  . Smoking status: Never Smoker   . Smokeless tobacco: Never Used  . Alcohol Use: No  . Drug Use: No  . Sexual Activity: None   Other Topics Concern  . None   Social History Narrative  . None    Her Allergies Are:  Allergies  Allergen Reactions  . Iodine   . Iohexol      Code: HIVES, Desc: reaction after a heart cath per pt--needs 13 hour prep, Onset Date: [id number]   :   Her Current Medications Are:  Outpatient Encounter Prescriptions as of 03/29/2014  Medication Sig  . albuterol (PROVENTIL HFA;VENTOLIN HFA) 108 (90 BASE) MCG/ACT inhaler Inhale 2 puffs into the lungs every 6 (six) hours as needed. For shortness of breath  . ALPRAZolam (XANAX) 0.5 MG tablet Take 0.5 tablets (0.25 mg total) by mouth 2 (two) times daily as needed. For anxiety  . amoxicillin (AMOXIL) 500 MG capsule   . carbidopa-levodopa-entacapone (STALEVO) 37.5-150-200 MG per tablet Take 1 tablet by mouth 4 (four) times daily.  . citalopram (CELEXA) 40 MG tablet TAKE 1 TABLET BY MOUTH IN THE MORNING  . ezetimibe (ZETIA) 10 MG tablet Take 10 mg by mouth at bedtime.   . fish oil-omega-3 fatty acids 1000 MG capsule Take 3 g by mouth daily.   Marland Kitchen HYDROcodone-acetaminophen (NORCO/VICODIN) 5-325 MG per tablet Take 1 tablet by mouth every 6 (six) hours as needed. For pain  . niacin 100 MG tablet Take 100 mg by mouth daily with breakfast.  . omeprazole (PRILOSEC) 40 MG capsule Take 1 capsule (40 mg total) by mouth every morning.  . prednisoLONE acetate (PRED FORTE) 1 % ophthalmic suspension   . PREVIDENT 5000 DRY MOUTH 1.1 % PSTE Place 1 application onto teeth 3 (three) times a week.   . traMADol (ULTRAM) 50 MG tablet Take 1  tablet by mouth as needed.  :  Review of Systems:  Out of a complete 14 point review of systems, all are reviewed and negative with the exception of these symptoms as listed below:  Review of Systems  Constitutional: Positive for fatigue.  HENT: Negative.   Eyes: Positive for photophobia and itching.  Respiratory: Positive for shortness of breath.   Cardiovascular: Negative.   Gastrointestinal: Positive for constipation.  Endocrine: Negative.   Genitourinary: Positive for decreased urine volume and difficulty urinating.  Musculoskeletal: Positive for arthralgias, back pain and gait problem.  Skin: Negative.   Allergic/Immunologic: Negative.   Neurological: Positive  for dizziness, speech difficulty and weakness.       Memory loss  Hematological: Negative.   Psychiatric/Behavioral: Positive for dysphoric mood.    Objective:  Neurologic Exam  Physical Exam Physical Examination:   Filed Vitals:   03/29/14 1512  BP: 106/62  Pulse: 78  Temp: 97.2 F (36.2 C)    General Examination: The patient is a very pleasant 80 y.o. female in no acute distress.  HEENT: Normocephalic, atraumatic, pupils are equal, round and reactive to light and accommodation. Funduscopic exam is normal with sharp disc margins noted. Extraocular tracking shows mild saccadic breakdown without nystagmus noted. There is limitation to upper gaze. There is mild decrease in eye blink rate. Hearing is intact.Face is symmetric with moderate facial masking and mild facial dyskinesias and normal facial sensation. There is no lip, neck or jaw tremor. Neck is moderately rigid with intact passive ROM. There are no carotid bruits on auscultation. Oropharynx exam reveals mild mouth dryness. No significant airway crowding is noted. Mallampati is class II. Tongue protrudes centrally and palate elevates symmetrically. There is no drooling.   Chest: is clear to auscultation without wheezing, rhonchi or crackles noted.  Heart:  sounds are regular and normal without murmurs, rubs or gallops noted.   Abdomen: is soft, non-tender and non-distended with normal bowel sounds appreciated on auscultation.  Extremities: There is no pitting edema in the distal lower extremities bilaterally.   Skin: is warm and dry with no trophic changes noted.  Musculoskeletal: exam reveals ulnar deviation and joint deformities in keeping with OA in her hands.   Neurologically:  Mental status: The patient is awake and alert, paying good attention. She is able to completely provide the history. Her husband provides details. She is oriented to: person, place, time/date, situation, day of week, month of year and year. Her memory, attention, language and knowledge are intact. There is no aphasia, agnosia, apraxia or anomia. There is a mild degree of bradyphrenia. Speech is mildly hypophonic with mild dysarthria noted. Mood is congruent and affect is normal.   Cranial nerves are as described above under HEENT exam. In addition, shoulder shrug is normal with equal shoulder height noted.  Motor exam: thin bulk, and normal strength for age is noted. There are mild generalized dyskinesias noted, which are intermittent. Tone is mildly rigid with presence of cogwheeling in the right upper extremity. There is overall moderate bradykinesia. There is no drift or rebound. There is no tremor. Romberg is negative. Reflexes are trace in the upper extremities and trace in the lower extremities. Fine motor skills exam reveals: Finger taps are moderately impaired on the right and mildly impaired on the left. Hand movements are moderately impaired on the right and mildly impaired on the left. RAP (rapid alternating patting) is moderately impaired on the right and mildly impaired on the left. Foot taps are moderately impaired on the right and moderately impaired on the left. Foot agility (in the form of heel stomping) is moderately impaired on the right and moderately  impaired on the left.    Cerebellar testing shows no dysmetria or intention tremor on finger to nose testing. Heel to shin is unremarkable bilaterally. There is no truncal or gait ataxia.   Sensory exam is intact to light touch, pinprick, vibration, temperature sense in the upper and lower extremities.   Gait, station and balance exan: She stands up from the seated position with mild difficulty but needs no assistance, and takes one attempt but pushes herself up from  the chair. No veering to one side is noted. She is not noted to lean to the side. Posture is moderately stooped with scoliosis noted. Stance is wide-based. She walks with decrease in stride length and pace and decreased arm swing on the right. She uses a 4 pronged cane but is able to walk without the cane some. She turns in 3 steps. Tandem walk is not possible. Balance is mildly impaired. She is not able to do a toe or heel stance.    Assessment and Plan:   In summary, Jill Hamilton is a very pleasant 80 year old female with an underlying medical history of hyperlipidemia, type 2 diabetes, scoliosis, asthma, arthritis, chronic LBP on as needed hydrocodone, per Dr. Nelva Bush, and anxiety, who presents for followup consultation of her right-sided predominant Parkinson's disease of approximately 9-years' duration.unfortunately she has had recurrent falls in the last 4 months. She has hit her head 2 times and needed stitches and had head CT both times at [facility]. She does endorse recent stress with her move and pushing herself to the limit. She is advised to use her cane withor her walker at all times and use her judgment. She is advised to stay well-hydrated, change positions slowly and not to overdo it. Stress can exacerbate PD symptoms. Overall, she has done well in the last 9 years. She had tried Requip in the past but had lower extremity swelling and had side effects with amantadine in the past as well. She is currently on generic  Stalevo 4 times a day and has had it for a day schedule for the past year since April 2014. I would not like to increase the Stalevo for fear of reducing her blood pressure, causing additional dyskinesias and other potential side effects such as hallucinations. She is advised that we will keep the medication the same at this time. I would like to consider physical therapy again with her. She would like to hold off and call me back for this. She may be able to do some physical therapy at her new retirement community. They are encouraged to look into that. She was in agreement. I will see her back in 4 months, sooner if the need arises and have asked her to call us with any interim questions, concerns, problems, updates or refill requests.

## 2014-04-06 DIAGNOSIS — J309 Allergic rhinitis, unspecified: Secondary | ICD-10-CM | POA: Diagnosis not present

## 2014-04-06 DIAGNOSIS — Z79899 Other long term (current) drug therapy: Secondary | ICD-10-CM | POA: Diagnosis not present

## 2014-04-06 DIAGNOSIS — M5137 Other intervertebral disc degeneration, lumbosacral region: Secondary | ICD-10-CM | POA: Diagnosis not present

## 2014-04-14 DIAGNOSIS — J309 Allergic rhinitis, unspecified: Secondary | ICD-10-CM | POA: Diagnosis not present

## 2014-04-20 DIAGNOSIS — J309 Allergic rhinitis, unspecified: Secondary | ICD-10-CM | POA: Diagnosis not present

## 2014-05-02 DIAGNOSIS — J309 Allergic rhinitis, unspecified: Secondary | ICD-10-CM | POA: Diagnosis not present

## 2014-05-10 DIAGNOSIS — J309 Allergic rhinitis, unspecified: Secondary | ICD-10-CM | POA: Diagnosis not present

## 2014-05-18 DIAGNOSIS — J309 Allergic rhinitis, unspecified: Secondary | ICD-10-CM | POA: Diagnosis not present

## 2014-05-25 DIAGNOSIS — J309 Allergic rhinitis, unspecified: Secondary | ICD-10-CM | POA: Diagnosis not present

## 2014-05-30 DIAGNOSIS — F4321 Adjustment disorder with depressed mood: Secondary | ICD-10-CM | POA: Diagnosis not present

## 2014-05-30 DIAGNOSIS — IMO0002 Reserved for concepts with insufficient information to code with codable children: Secondary | ICD-10-CM | POA: Diagnosis not present

## 2014-05-30 DIAGNOSIS — R82998 Other abnormal findings in urine: Secondary | ICD-10-CM | POA: Diagnosis not present

## 2014-05-30 DIAGNOSIS — J309 Allergic rhinitis, unspecified: Secondary | ICD-10-CM | POA: Diagnosis not present

## 2014-05-30 DIAGNOSIS — R634 Abnormal weight loss: Secondary | ICD-10-CM | POA: Diagnosis not present

## 2014-05-30 DIAGNOSIS — R350 Frequency of micturition: Secondary | ICD-10-CM | POA: Diagnosis not present

## 2014-05-30 DIAGNOSIS — G20 Parkinson's disease: Secondary | ICD-10-CM | POA: Diagnosis not present

## 2014-05-30 DIAGNOSIS — K2270 Barrett's esophagus without dysplasia: Secondary | ICD-10-CM | POA: Diagnosis not present

## 2014-05-30 DIAGNOSIS — M810 Age-related osteoporosis without current pathological fracture: Secondary | ICD-10-CM | POA: Diagnosis not present

## 2014-06-06 DIAGNOSIS — H40019 Open angle with borderline findings, low risk, unspecified eye: Secondary | ICD-10-CM | POA: Diagnosis not present

## 2014-06-08 DIAGNOSIS — J309 Allergic rhinitis, unspecified: Secondary | ICD-10-CM | POA: Diagnosis not present

## 2014-06-08 DIAGNOSIS — Z681 Body mass index (BMI) 19 or less, adult: Secondary | ICD-10-CM | POA: Diagnosis not present

## 2014-06-08 DIAGNOSIS — M810 Age-related osteoporosis without current pathological fracture: Secondary | ICD-10-CM | POA: Diagnosis not present

## 2014-06-15 DIAGNOSIS — J309 Allergic rhinitis, unspecified: Secondary | ICD-10-CM | POA: Diagnosis not present

## 2014-06-20 DIAGNOSIS — J309 Allergic rhinitis, unspecified: Secondary | ICD-10-CM | POA: Diagnosis not present

## 2014-06-22 DIAGNOSIS — J309 Allergic rhinitis, unspecified: Secondary | ICD-10-CM | POA: Diagnosis not present

## 2014-06-27 DIAGNOSIS — H4011X0 Primary open-angle glaucoma, stage unspecified: Secondary | ICD-10-CM | POA: Diagnosis not present

## 2014-06-27 DIAGNOSIS — J309 Allergic rhinitis, unspecified: Secondary | ICD-10-CM | POA: Diagnosis not present

## 2014-06-29 DIAGNOSIS — J309 Allergic rhinitis, unspecified: Secondary | ICD-10-CM | POA: Diagnosis not present

## 2014-07-06 DIAGNOSIS — J309 Allergic rhinitis, unspecified: Secondary | ICD-10-CM | POA: Diagnosis not present

## 2014-07-13 DIAGNOSIS — J309 Allergic rhinitis, unspecified: Secondary | ICD-10-CM | POA: Diagnosis not present

## 2014-07-19 DIAGNOSIS — H4011X0 Primary open-angle glaucoma, stage unspecified: Secondary | ICD-10-CM | POA: Diagnosis not present

## 2014-07-20 DIAGNOSIS — J309 Allergic rhinitis, unspecified: Secondary | ICD-10-CM | POA: Diagnosis not present

## 2014-07-26 DIAGNOSIS — J309 Allergic rhinitis, unspecified: Secondary | ICD-10-CM | POA: Diagnosis not present

## 2014-07-27 DIAGNOSIS — Z23 Encounter for immunization: Secondary | ICD-10-CM | POA: Diagnosis not present

## 2014-08-01 DIAGNOSIS — Z1231 Encounter for screening mammogram for malignant neoplasm of breast: Secondary | ICD-10-CM | POA: Diagnosis not present

## 2014-08-01 DIAGNOSIS — J309 Allergic rhinitis, unspecified: Secondary | ICD-10-CM | POA: Diagnosis not present

## 2014-08-17 DIAGNOSIS — J3089 Other allergic rhinitis: Secondary | ICD-10-CM | POA: Diagnosis not present

## 2014-08-22 DIAGNOSIS — J3089 Other allergic rhinitis: Secondary | ICD-10-CM | POA: Diagnosis not present

## 2014-08-29 DIAGNOSIS — J3089 Other allergic rhinitis: Secondary | ICD-10-CM | POA: Diagnosis not present

## 2014-09-01 ENCOUNTER — Encounter: Payer: Self-pay | Admitting: Neurology

## 2014-09-01 ENCOUNTER — Ambulatory Visit (INDEPENDENT_AMBULATORY_CARE_PROVIDER_SITE_OTHER): Payer: Medicare Other | Admitting: Neurology

## 2014-09-01 VITALS — BP 110/57 | Temp 97.6°F | Ht 62.0 in | Wt 107.0 lb

## 2014-09-01 DIAGNOSIS — G20 Parkinson's disease: Secondary | ICD-10-CM | POA: Diagnosis not present

## 2014-09-01 DIAGNOSIS — R296 Repeated falls: Secondary | ICD-10-CM | POA: Diagnosis not present

## 2014-09-01 DIAGNOSIS — G249 Dystonia, unspecified: Secondary | ICD-10-CM | POA: Diagnosis not present

## 2014-09-01 MED ORDER — CARBIDOPA-LEVODOPA-ENTACAPONE 37.5-150-200 MG PO TABS: 1.0000 | ORAL_TABLET | Freq: Every day | ORAL | Status: DC

## 2014-09-01 NOTE — Progress Notes (Signed)
Subjective:    Patient ID: Jill Hamilton is a 80 y.o. female.  HPI    Interim history:   Jill Hamilton is a very pleasant 80 year-old right-handed woman with an underlying medical history of hyperlipidemia, type 2 diabetes, scoliosis, asthma, arthritis and anxiety, who presents for followup consultation of her right-sided predominant Parkinson's disease of approximately 11-years duration. She is accompanied by her husband today. I last saw her on 03/29/2014, at which time she reported having fallen 4 or 5 times during her move from their home to independent living in [place]. They moved on 03/05/2014 and she reported liking their new place, it certainly was a big transition leaving their own home of 46 years. She fell in April and hit her head and needed stitches and again fell in May and hit her head and needed stitches. She was taken to [facility] and had a CT head and this was negative per patient and her husband. She had been using her 4 prong cane. She also has a 4 wheeled walker with seat. She reported some lack of energy and fatigue but also had been pushing herself during that move to her new place. I did not make any changes to her medications but we talked about gait safety, and staying well hydrated and not pushing the limits of her physical stamina.  Today, she feels somewhat worse in her walking and her balance and she feels, that her stamina is less. Her appetite is good and weight is stable. She takes hydrocodone usually 1/2 bid. Her constipation is better. She has fallen once and has had some near falls. She uses her cane.   I saw her on 11/15/2013, at which time I suggested she use her cane as needed. I did not change her medications as I felt she was fairly stable. She reported a recent fall with bruised her hip and did not injure herself. She endorses some memory loss and no depression and mild constipation treated with MiraLax every other day.  I saw her on 05/04/2013, at  which time I continued her on Stalevo 150 mg 4 times a day. However, in July she called back reporting an increase in price of generic Stalevo and therefore I switched her to a generic Sinemet and added Comtan, but the combination was even more expensive, so she switched back to generic Stalevo.  I first met her on 02/25/2013 and she previously followed with Dr. Morene Antu and was last seen by him on 12/14/2012 at which time he kept her on the same medication regimen. She presented for a sooner appointment rather than her 6 month followup because of flareup in her Parkinson's symptoms after her hiatal hernia surgery on 12/15/2012. She began experiencing worsening balance and stiffness and problems with her gait and started using a cane since her surgery which she had not used before.  She was diagnosed with Parkinson's disease in 2006 and initially started on carbidopa-levodopa with Requip added. She developed dyskinesias and had side effects with amantadine including livedo reticularis. She has swelling in both legs and left knee pain and needs perhaps a knee replacement. She had arthroscopic surgery some 3 years ago on the same knee. She has lower back pain and had a lumbar spine MRI in the past as well as x-rays and has undergone 3 epidural injections.  I increased her Stalevo to 4 times a day from 3 times a day in 4/14, at 7, 11, 3 and 7. I also recommended that  she continue using her 4 pronged cane and recommended of 2 month followup. She noticed improvement with the increase in Stalevo and stopped using her cane.   Her Past Medical History Is Significant For: Past Medical History  Diagnosis Date  . Hyperlipidemia   . Parkinson's disease   . Diabetes mellitus without complication   . Scoliosis   . Asthma   . Arthritis   . Anxiety     Her Past Surgical History Is Significant For: Past Surgical History  Procedure Laterality Date  . Abdominal hysterectomy    . Cholecystectomy    . Carpal  tunnel release      bilateral  . Hammer toe surgery      bilateral  . Esophagogastroduodenoscopy  08/12/12  . Appendectomy      with hysterectomy  . Bladder surgery      bladder tacked  . Hiatal hernia repair  12/15/2012    laparascopic repair  . Hiatal hernia repair  12/15/2012    Procedure: LAPAROSCOPIC REPAIR OF HIATAL HERNIA;  Surgeon: [name], MD;  Location: [facility];  Service: General;;    Her Family History Is Significant For: Family History  Problem Relation Age of Onset  . Heart disease Father     Her Social History Is Significant For: History   Social History  . Marital Status: Married    Spouse Name: Lurline Hare    Number of Children: 1  . Years of Education: 12   Occupational History  .      retired    Social History Main Topics  . Smoking status: Never Smoker   . Smokeless tobacco: Never Used  . Alcohol Use: No  . Drug Use: No  . Sexual Activity: None   Other Topics Concern  . None   Social History Narrative  . None    Her Allergies Are:  Allergies  Allergen Reactions  . Iodine   . Iohexol      Code: HIVES, Desc: reaction after a heart cath per pt--needs 13 hour prep, Onset Date: [id number]   :   Her Current Medications Are:  Outpatient Encounter Prescriptions as of 09/01/2014  Medication Sig  . albuterol (PROVENTIL HFA;VENTOLIN HFA) 108 (90 BASE) MCG/ACT inhaler Inhale 2 puffs into the lungs every 6 (six) hours as needed. For shortness of breath  . ALPRAZolam (XANAX) 0.5 MG tablet Take 0.5 tablets (0.25 mg total) by mouth 2 (two) times daily as needed. For anxiety  . amoxicillin (AMOXIL) 500 MG capsule Taken when going to dentist  . carbidopa-levodopa-entacapone (STALEVO) 37.5-150-200 MG per tablet Take 1 tablet by mouth 4 (four) times daily.  . citalopram (CELEXA) 40 MG tablet TAKE 1 TABLET BY MOUTH IN THE MORNING  . ezetimibe (ZETIA) 10 MG tablet Take 10 mg by mouth at bedtime.   . fish oil-omega-3 fatty acids 1000 MG capsule Take 3 g by  mouth daily.   Marland Kitchen HYDROcodone-acetaminophen (NORCO/VICODIN) 5-325 MG per tablet Take 1 tablet by mouth every 6 (six) hours as needed. For pain  . niacin 100 MG tablet Take 100 mg by mouth daily with breakfast.  . omeprazole (PRILOSEC) 40 MG capsule Take 1 capsule (40 mg total) by mouth every morning.  . prednisoLONE acetate (PRED FORTE) 1 % ophthalmic suspension   . PREVIDENT 5000 DRY MOUTH 1.1 % PSTE Place 1 application onto teeth 3 (three) times a week.   . traMADol (ULTRAM) 50 MG tablet Take 1 tablet by mouth as needed.  Dorette Grate Z  0.004 % SOLN ophthalmic solution daily. Both eyes  :  Review of Systems:  Out of a complete 14 point review of systems, all are reviewed and negative with the exception of these symptoms as listed below:   Review of Systems  Eyes: Positive for itching.       Light sensitivity  Respiratory: Positive for cough and wheezing.   Gastrointestinal: Positive for constipation.  Genitourinary: Positive for difficulty urinating.  Musculoskeletal: Positive for back pain, gait problem and neck stiffness.       Joint pain  Neurological:       Memory loss    Objective:  Neurologic Exam  Physical Exam Physical Examination:   Filed Vitals:   09/01/14 1434  BP: 110/57  Temp: 97.6 F (36.4 C)    General Examination: The patient is a very pleasant 80 y.o. female in no acute distress.  HEENT: Normocephalic, atraumatic, pupils are equal, round and reactive to light and accommodation. Funduscopic exam is normal with sharp disc margins noted. Extraocular tracking shows mild saccadic breakdown without nystagmus noted. There is limitation to upper gaze. There is mild decrease in eye blink rate. Hearing is intact.Face is symmetric with moderate facial masking and mild facial dyskinesias and normal facial sensation. There is no lip, neck or jaw tremor. Neck is moderately rigid with intact passive ROM. There are no carotid bruits on auscultation. Oropharynx exam reveals  mild mouth dryness. No significant airway crowding is noted. Mallampati is class II. Tongue protrudes centrally and palate elevates symmetrically. There is no drooling. She has mild neck dyskinesias.  Chest: is clear to auscultation without wheezing, rhonchi or crackles noted.  Heart: sounds are regular and normal without murmurs, rubs or gallops noted.   Abdomen: is soft, non-tender and non-distended with normal bowel sounds appreciated on auscultation.  Extremities: There is no pitting edema in the distal lower extremities bilaterally.   Skin: is warm and dry with no trophic changes noted.  Musculoskeletal: exam reveals ulnar deviation and joint deformities in keeping with OA in her hands.   Neurologically:  Mental status: The patient is awake and alert, paying good attention. She is able to completely provide the history. Her husband provides details. She is oriented to: person, place, time/date, situation, day of week, month of year and year. Her memory, attention, language and knowledge are intact. There is no aphasia, agnosia, apraxia or anomia. There is a mild degree of bradyphrenia. Speech is mild to moderately hypophonic with mild dysarthria noted. Mood is congruent and affect is normal.   Cranial nerves are as described above under HEENT exam. In addition, shoulder shrug is normal with equal shoulder height noted.  Motor exam: thin bulk, and normal strength for age is noted. There are mild generalized dyskinesias noted, which are intermittent. Tone is mildly rigid  throughout. There is overall moderate bradykinesia. There is no drift or rebound. There is no tremor. Romberg is negative. Reflexes are trace in the upper extremities and trace in the lower extremities. Fine motor skills exam reveals: Finger taps are moderately impaired on the right and mildly impaired on the left. Hand movements are moderately impaired on the right and mildly impaired on the left. RAP (rapid alternating  patting) is moderately impaired on the right and mildly impaired on the left. Foot taps are moderately impaired on the right and moderately impaired on the left. Foot agility (in the form of heel stomping) is moderately impaired on the right and moderately impaired on the left.  Cerebellar testing shows no dysmetria or intention tremor on finger to nose testing. Heel to shin is unremarkable bilaterally. There is no truncal or gait ataxia.   Sensory exam is intact to light touch, pinprick, vibration, temperature sense in the upper and lower extremities.   Gait, station and balance exan: She stands up from the seated position with mild difficulty but needs no assistance, and takes one attempt but pushes herself up from the chair. No veering to one side is noted. She is not noted to lean to the side. Posture is moderately stooped with scoliosis noted. Stance is wide-based. She walks with decrease in stride length and pace and decreased arm swing on the right. She uses a 4 pronged cane but is able to walk without the cane some. She turns in 5 steps and has some insecurity with turns. Tandem walk is not possible. Balance is mildly impaired. She is not able to do a toe or heel stance.    Assessment and Plan:   In summary, ARADIA ESTEY is a very pleasant 80 year old female with an underlying medical history of hyperlipidemia, type 2 diabetes, scoliosis, asthma, arthritis, chronic LBP on as needed hydrocodone, per Dr. Nelva Bush, and anxiety, who presents for followup consultation of her right-sided predominant Parkinson's disease of approximately 9-years' duration, complicated by mild constipation, recurrent falls, and generalized dyskinesias which have been mild overall. Thankfully, she has not fallen recently but does have worsening of her balance and has noted worsening of her gait. She currently takes her Stalevo 150 4 times a day at 7, 11, 3 and 7. She is requesting whether we can try an increased dose. I  would like to try it with her but did make her aware that she may be experiencing side effects to when we go up on the dose. This would include possible sleepiness, lightheadedness, hallucinations, and worsening dyskinesias. If the side effects outweigh the benefit which would include improvement of her walking and her fine motor skills, then we have to scale back again. If the benefit outweighs the side effects we can stay on the increased dose. She and her husband were in agreement. They have settled it nicely into their independent living facility and have been able to sell their home as well. I will see her back in 4 months, sooner if the need arises and have asked her to call us with any interim questions, concerns, problems, updates or refill requests. She is also encouraged to sign up for the electronic chart access.

## 2014-09-01 NOTE — Patient Instructions (Signed)
We will increase your Stalevo 150 mg to 5 times a day: at 7 AM, 10 AM, 1 PM, 4 PM and 7 PM.

## 2014-09-05 DIAGNOSIS — J3089 Other allergic rhinitis: Secondary | ICD-10-CM | POA: Diagnosis not present

## 2014-09-12 DIAGNOSIS — R350 Frequency of micturition: Secondary | ICD-10-CM | POA: Diagnosis not present

## 2014-09-12 DIAGNOSIS — M1990 Unspecified osteoarthritis, unspecified site: Secondary | ICD-10-CM | POA: Diagnosis not present

## 2014-09-12 DIAGNOSIS — J3089 Other allergic rhinitis: Secondary | ICD-10-CM | POA: Diagnosis not present

## 2014-09-12 DIAGNOSIS — G20 Parkinson's disease: Secondary | ICD-10-CM | POA: Diagnosis not present

## 2014-09-12 DIAGNOSIS — Z681 Body mass index (BMI) 19 or less, adult: Secondary | ICD-10-CM | POA: Diagnosis not present

## 2014-09-12 DIAGNOSIS — R300 Dysuria: Secondary | ICD-10-CM | POA: Diagnosis not present

## 2014-09-18 IMAGING — RF DG UGI W/ GASTROGRAFIN
14 of 24 series · 14 of 24 positions shown · non-contrast
Comparison: 08/17/2012

CLINICAL DATA: Status post hiatal hernia repair.

ESOPHOGRAM/BARIUM SWALLOW
TECHNIQUE: Single contrast examination was performed using water-
soluble contrast material..
Fluoroscopy time:  3.3 minutes.

[Series 1: run · 1 of 7 slices shown (1 of 14)]
[im 1/7]
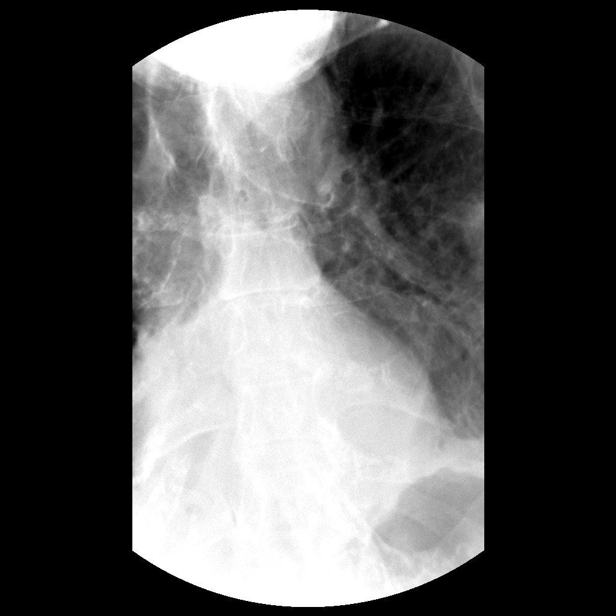

[Series 3: run · 1 of 1 slices shown (2 of 14)]
[im 1/1]
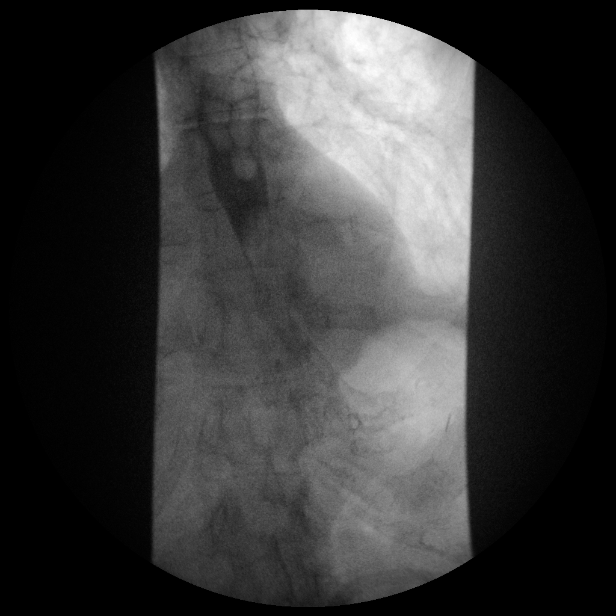

[Series 5: run · 1 of 1 slices shown (3 of 14)]
[im 1/1]
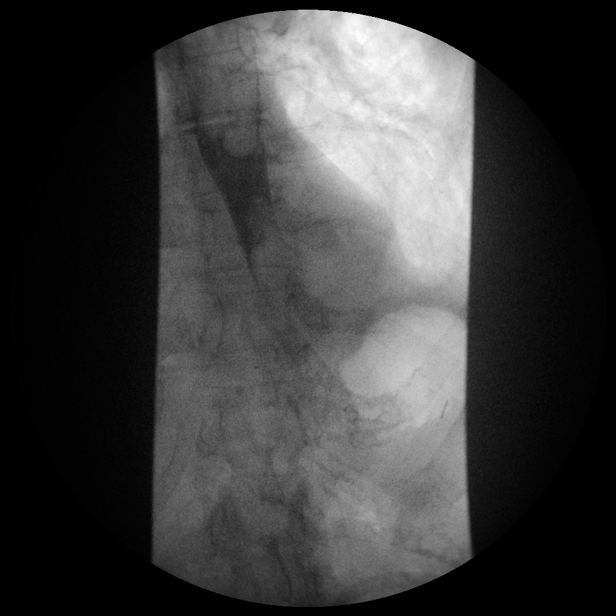

[Series 7: run · 1 of 1 slices shown (4 of 14)]
[im 1/1]
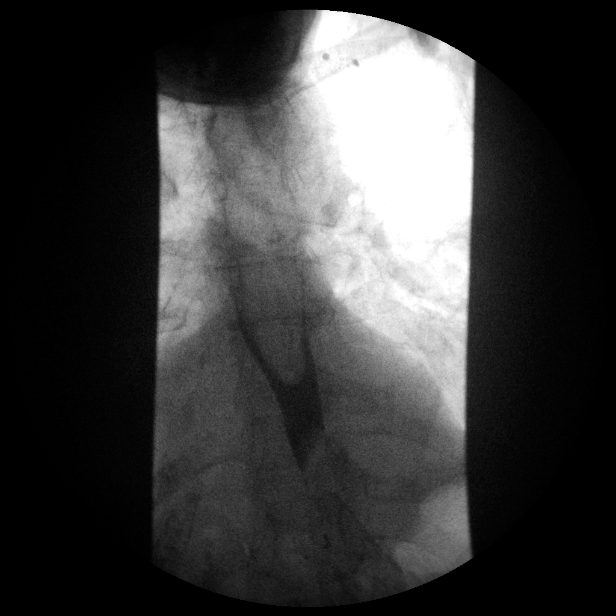

[Series 8: run · 1 of 1 slices shown (5 of 14)]
[im 1/1]
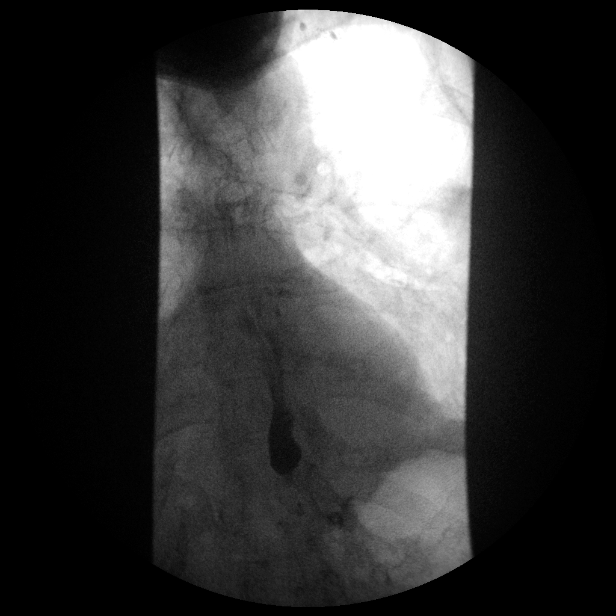

[Series 10: run · 1 of 1 slices shown (6 of 14)]
[im 1/1]
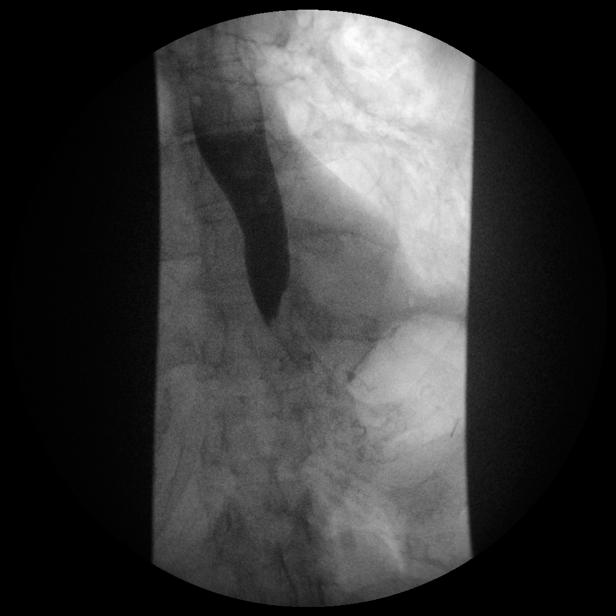

[Series 12: run · 1 of 1 slices shown (7 of 14)]
[im 1/1]
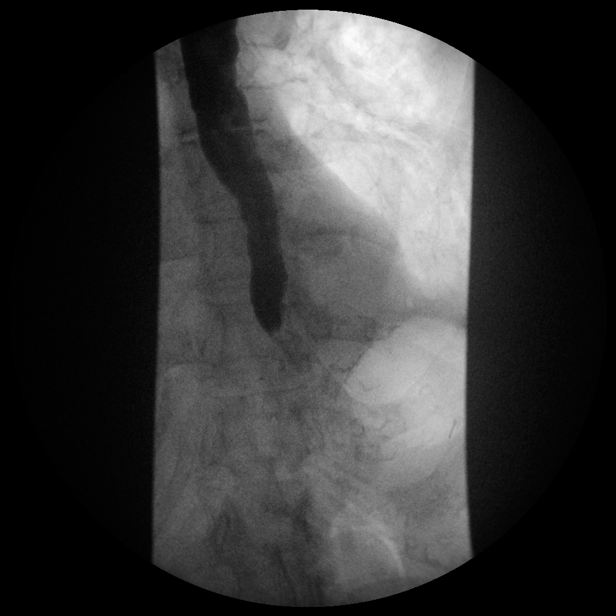

[Series 13: run · 1 of 1 slices shown (8 of 14)]
[im 1/1]
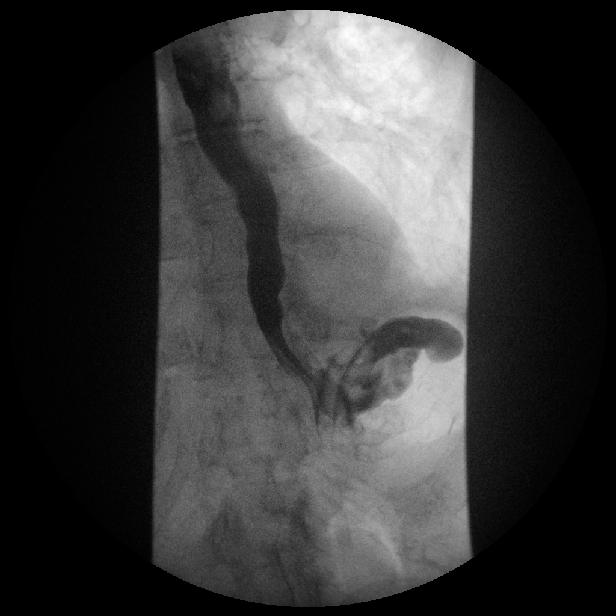

[Series 15: run · 1 of 1 slices shown (9 of 14)]
[im 1/1]
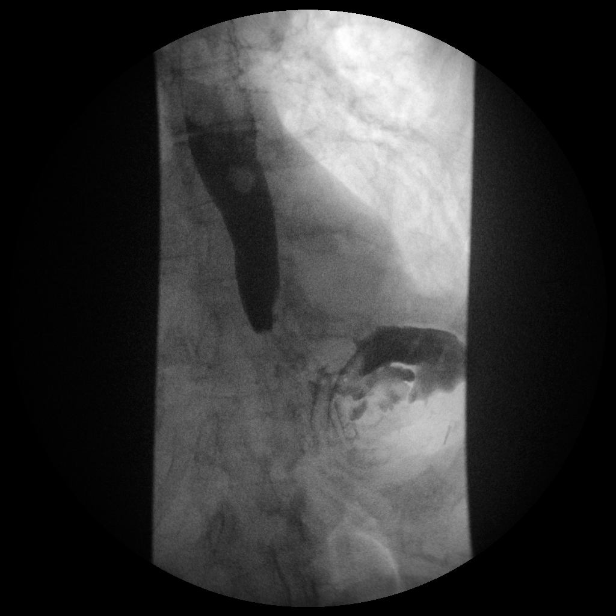

[Series 17: run · 1 of 1 slices shown (10 of 14)]
[im 1/1]
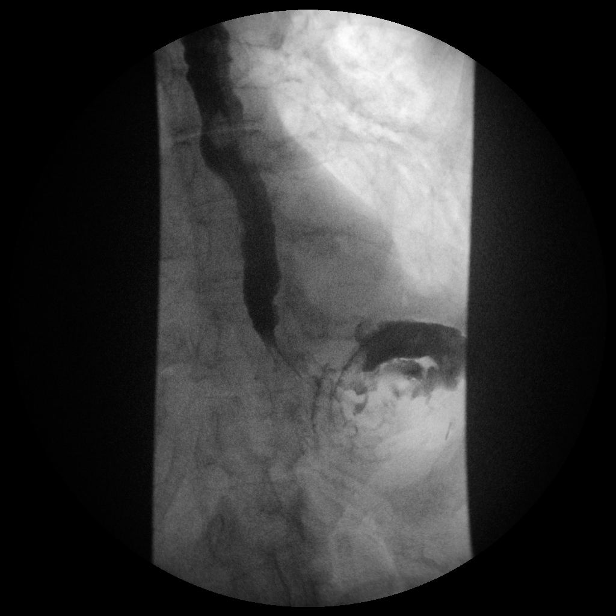

[Series 19: run · 1 of 1 slices shown (11 of 14)]
[im 1/1]
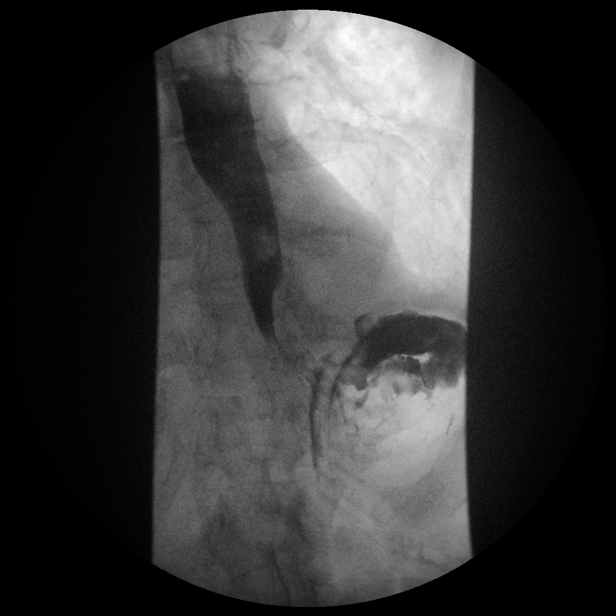

[Series 20: run · 1 of 1 slices shown (12 of 14)]
[im 1/1]
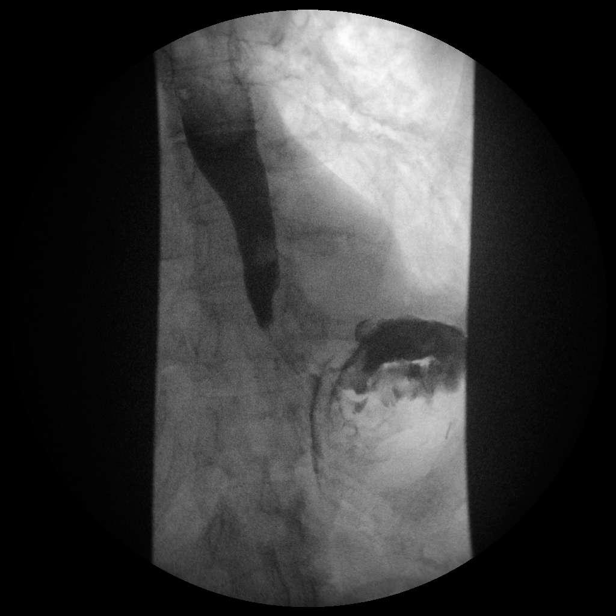

[Series 22: run · 1 of 1 slices shown (13 of 14)]
[im 1/1]
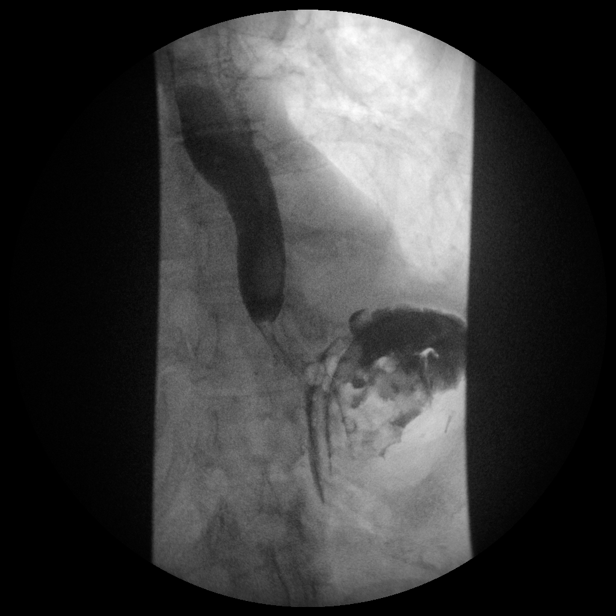

[Series 24: run · 1 of 1 slices shown (14 of 14)]
[im 1/1]
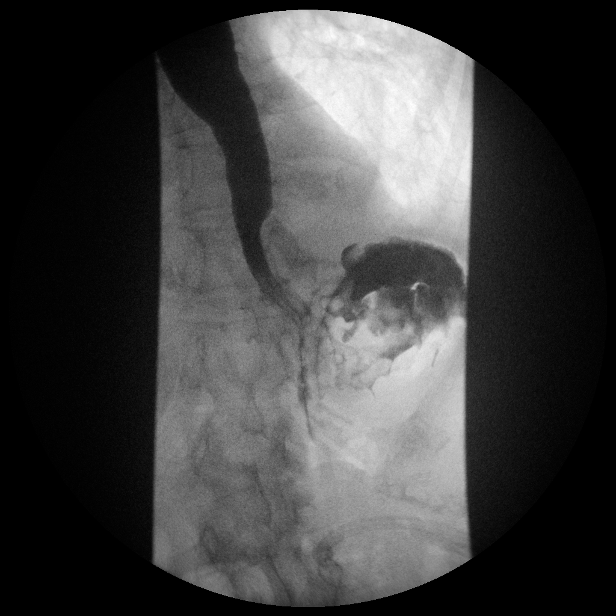

[14 of 24 positions shown; findings below may reference images not displayed]

FINDINGS: Given the patients recent surgery, the exam was
performed in a 30 degrees head-up position using both shallow right
and left anterior oblique positions relative to the fluoro table.

When given swallows of water soluble contrast material, the
contrast can be seen to pass through the region of the
esophagogastric junction into the proximal stomach.  Compared to
the preoperative upper GI study, the esophagogastric junction now
is positioned at the expected anatomic location.  No evidence for
contrast extravasation to suggest a leak in the region of the
esophagogastric junction.
IMPRESSION: The esophagogastric junction now appears positioned at the expected
anatomic location.  Water-soluble contrast material passes through
the esophagogastric junction into the proximal esophagus without
evidence for extravasation to suggest leak.

## 2014-09-20 DIAGNOSIS — J3089 Other allergic rhinitis: Secondary | ICD-10-CM | POA: Diagnosis not present

## 2014-09-26 DIAGNOSIS — J3089 Other allergic rhinitis: Secondary | ICD-10-CM | POA: Diagnosis not present

## 2014-10-03 DIAGNOSIS — J3089 Other allergic rhinitis: Secondary | ICD-10-CM | POA: Diagnosis not present

## 2014-10-03 DIAGNOSIS — G894 Chronic pain syndrome: Secondary | ICD-10-CM | POA: Diagnosis not present

## 2014-10-03 DIAGNOSIS — Z79891 Long term (current) use of opiate analgesic: Secondary | ICD-10-CM | POA: Diagnosis not present

## 2014-10-03 DIAGNOSIS — M5136 Other intervertebral disc degeneration, lumbar region: Secondary | ICD-10-CM | POA: Diagnosis not present

## 2014-10-10 DIAGNOSIS — J3089 Other allergic rhinitis: Secondary | ICD-10-CM | POA: Diagnosis not present

## 2014-10-17 DIAGNOSIS — J3089 Other allergic rhinitis: Secondary | ICD-10-CM | POA: Diagnosis not present

## 2014-10-18 DIAGNOSIS — J3089 Other allergic rhinitis: Secondary | ICD-10-CM | POA: Diagnosis not present

## 2014-10-19 DIAGNOSIS — H4011X1 Primary open-angle glaucoma, mild stage: Secondary | ICD-10-CM | POA: Diagnosis not present

## 2014-10-25 DIAGNOSIS — J3089 Other allergic rhinitis: Secondary | ICD-10-CM | POA: Diagnosis not present

## 2014-10-31 DIAGNOSIS — J3089 Other allergic rhinitis: Secondary | ICD-10-CM | POA: Diagnosis not present

## 2014-11-07 DIAGNOSIS — J3089 Other allergic rhinitis: Secondary | ICD-10-CM | POA: Diagnosis not present

## 2014-11-15 DIAGNOSIS — J3089 Other allergic rhinitis: Secondary | ICD-10-CM | POA: Diagnosis not present

## 2014-11-15 DIAGNOSIS — E780 Pure hypercholesterolemia: Secondary | ICD-10-CM | POA: Diagnosis not present

## 2014-11-15 DIAGNOSIS — M859 Disorder of bone density and structure, unspecified: Secondary | ICD-10-CM | POA: Diagnosis not present

## 2014-11-15 DIAGNOSIS — N390 Urinary tract infection, site not specified: Secondary | ICD-10-CM | POA: Diagnosis not present

## 2014-11-15 DIAGNOSIS — R8299 Other abnormal findings in urine: Secondary | ICD-10-CM | POA: Diagnosis not present

## 2014-11-16 ENCOUNTER — Telehealth: Payer: Self-pay | Admitting: Neurology

## 2014-11-16 MED ORDER — CITALOPRAM HYDROBROMIDE 40 MG PO TABS: 40.0000 mg | ORAL_TABLET | Freq: Every morning | ORAL | Status: DC

## 2014-11-16 NOTE — Telephone Encounter (Signed)
Rx has been sent.  Pharmacy is aware.

## 2014-11-16 NOTE — Telephone Encounter (Signed)
Jill Hamilton with [facility] @ [phone], requesting Rx refill for citalopram (CELEXA) 40 MG tablet.  Please call and advise.

## 2014-11-22 DIAGNOSIS — Z1389 Encounter for screening for other disorder: Secondary | ICD-10-CM | POA: Diagnosis not present

## 2014-11-22 DIAGNOSIS — Z23 Encounter for immunization: Secondary | ICD-10-CM | POA: Diagnosis not present

## 2014-11-22 DIAGNOSIS — K219 Gastro-esophageal reflux disease without esophagitis: Secondary | ICD-10-CM | POA: Diagnosis not present

## 2014-11-22 DIAGNOSIS — R1906 Epigastric swelling, mass or lump: Secondary | ICD-10-CM | POA: Diagnosis not present

## 2014-11-22 DIAGNOSIS — J3089 Other allergic rhinitis: Secondary | ICD-10-CM | POA: Diagnosis not present

## 2014-11-22 DIAGNOSIS — M1990 Unspecified osteoarthritis, unspecified site: Secondary | ICD-10-CM | POA: Diagnosis not present

## 2014-11-22 DIAGNOSIS — G20 Parkinson's disease: Secondary | ICD-10-CM | POA: Diagnosis not present

## 2014-11-22 DIAGNOSIS — K2270 Barrett's esophagus without dysplasia: Secondary | ICD-10-CM | POA: Diagnosis not present

## 2014-11-22 DIAGNOSIS — Z008 Encounter for other general examination: Secondary | ICD-10-CM | POA: Diagnosis not present

## 2014-11-22 DIAGNOSIS — M810 Age-related osteoporosis without current pathological fracture: Secondary | ICD-10-CM | POA: Diagnosis not present

## 2014-11-22 DIAGNOSIS — J45909 Unspecified asthma, uncomplicated: Secondary | ICD-10-CM | POA: Diagnosis not present

## 2014-11-23 DIAGNOSIS — Z1212 Encounter for screening for malignant neoplasm of rectum: Secondary | ICD-10-CM | POA: Diagnosis not present

## 2014-11-30 DIAGNOSIS — H1045 Other chronic allergic conjunctivitis: Secondary | ICD-10-CM | POA: Diagnosis not present

## 2014-11-30 DIAGNOSIS — J3089 Other allergic rhinitis: Secondary | ICD-10-CM | POA: Diagnosis not present

## 2014-11-30 DIAGNOSIS — J4520 Mild intermittent asthma, uncomplicated: Secondary | ICD-10-CM | POA: Diagnosis not present

## 2014-12-07 DIAGNOSIS — J3089 Other allergic rhinitis: Secondary | ICD-10-CM | POA: Diagnosis not present

## 2014-12-13 DIAGNOSIS — J3089 Other allergic rhinitis: Secondary | ICD-10-CM | POA: Diagnosis not present

## 2014-12-19 DIAGNOSIS — J3089 Other allergic rhinitis: Secondary | ICD-10-CM | POA: Diagnosis not present

## 2014-12-26 ENCOUNTER — Encounter (HOSPITAL_COMMUNITY): Payer: Medicare Other

## 2014-12-28 DIAGNOSIS — J3089 Other allergic rhinitis: Secondary | ICD-10-CM | POA: Diagnosis not present

## 2014-12-28 DIAGNOSIS — M810 Age-related osteoporosis without current pathological fracture: Secondary | ICD-10-CM | POA: Diagnosis not present

## 2014-12-28 DIAGNOSIS — Z79899 Other long term (current) drug therapy: Secondary | ICD-10-CM | POA: Diagnosis not present

## 2015-01-04 ENCOUNTER — Ambulatory Visit: Payer: Medicare Other | Admitting: Neurology

## 2015-01-09 ENCOUNTER — Encounter: Payer: Self-pay | Admitting: Neurology

## 2015-01-09 ENCOUNTER — Ambulatory Visit (INDEPENDENT_AMBULATORY_CARE_PROVIDER_SITE_OTHER): Payer: Medicare Other | Admitting: Neurology

## 2015-01-09 VITALS — BP 122/73 | HR 80 | Temp 96.6°F | Wt 106.0 lb

## 2015-01-09 DIAGNOSIS — R296 Repeated falls: Secondary | ICD-10-CM

## 2015-01-09 DIAGNOSIS — G20 Parkinson's disease: Secondary | ICD-10-CM | POA: Diagnosis not present

## 2015-01-09 DIAGNOSIS — G249 Dystonia, unspecified: Secondary | ICD-10-CM | POA: Diagnosis not present

## 2015-01-09 DIAGNOSIS — J3089 Other allergic rhinitis: Secondary | ICD-10-CM | POA: Diagnosis not present

## 2015-01-09 NOTE — Patient Instructions (Signed)
Please remember, that spells of low energy may be due to low blood pressure. Sit down again and pump your calves, may drink a sip of cola or coffee, change positions slowly and always stay well-hydrated. Use your cane at all times.

## 2015-01-09 NOTE — Progress Notes (Signed)
Subjective:    Patient ID: Jill Hamilton is a 81 y.o. female.  HPI     Interim history:    Ms. Schuchard is a very pleasant 81 year-old right-handed woman with an underlying medical history of hyperlipidemia, type 2 diabetes, scoliosis, asthma, arthritis and anxiety, who presents for followup consultation of her right-sided predominant Parkinson's disease of approximately 11-years duration. She is accompanied by her husband again today. I last saw her on 09/01/14, at which time she reported worseing of her walking and her balance and she felt, she had less stamina. Her appetite and weight were stable. She was taking her pain medication as needed. She was using her cane. She had fallen once. We mutually agreed to increase her Stalevo 150-1 pill 5 times a day.  Today, she reports, feeling fairly well. She believes, the increase in Stalevo was helpful. She has ongoing issues with spells of low energy. These seem to be more in the mornings, especially after breakfast. She feels that she is drained intermittent feels she needs to sit back down. This happens especially after she stands up after sitting for prolonged period of time. Sometimes she feels like this after discharge. Thankfully, she has not fallen.  I saw her on 03/29/2014, at which time she reported having fallen 4 or 5 times during her move from their home to independent living in [place]. They moved on 03/05/2014 and she reported liking their new place, it certainly was a big transition leaving their own home of 46 years. She fell in April and hit her head and needed stitches and again fell in May and hit her head and needed stitches. She was taken to [facility] and had a CT head and this was negative per patient and her husband. She had been using her 4 prong cane. She also has a 4 wheeled walker with seat. She reported some lack of energy and fatigue but also had been pushing herself during that move to her new place. I did not make  any changes to her medications but we talked about gait safety, and staying well hydrated and not pushing the limits of her physical stamina.  I saw her on 11/15/2013, at which time I suggested she use her cane as needed. I did not change her medications as I felt she was fairly stable. She reported a recent fall with bruised her hip and did not injure herself. She endorses some memory loss and no depression and mild constipation treated with MiraLax every other day.   I saw her on 05/04/2013, at which time I continued her on Stalevo 150 mg 4 times a day. However, in July she called back reporting an increase in price of generic Stalevo and therefore I switched her to a generic Sinemet and added Comtan, but the combination was even more expensive, so she switched back to generic Stalevo.   I first met her on 02/25/2013 and she previously followed with Dr. Morene Antu and was last seen by him on 12/14/2012 at which time he kept her on the same medication regimen. She presented for a sooner appointment rather than her 6 month followup because of flareup in her Parkinson's symptoms after her hiatal hernia surgery on 12/15/2012. She began experiencing worsening balance and stiffness and problems with her gait and started using a cane since her surgery which she had not used before.   She was diagnosed with Parkinson's disease in 2006 and initially started on carbidopa-levodopa with Requip added. She developed  dyskinesias and had side effects with amantadine including livedo reticularis. She has swelling in both legs and left knee pain and needs perhaps a knee replacement. She had arthroscopic surgery some 3 years ago on the same knee. She has lower back pain and had a lumbar spine MRI in the past as well as x-rays and has undergone 3 epidural injections.   I increased her Stalevo to 4 times a day from 3 times a day in 4/14, at 7, 11, 3 and 7. I also recommended that she continue using her 4 pronged cane and  recommended of 2 month followup. She noticed improvement with the increase in Stalevo and she had stopped using her cane.   Her Past Medical History Is Significant For: Past Medical History  Diagnosis Date  . Hyperlipidemia   . Parkinson's disease   . Diabetes mellitus without complication   . Scoliosis   . Asthma   . Arthritis   . Anxiety     Her Past Surgical History Is Significant For: Past Surgical History  Procedure Laterality Date  . Abdominal hysterectomy    . Cholecystectomy    . Carpal tunnel release      bilateral  . Hammer toe surgery      bilateral  . Esophagogastroduodenoscopy  08/12/12  . Appendectomy      with hysterectomy  . Bladder surgery      bladder tacked  . Hiatal hernia repair  12/15/2012    laparascopic repair  . Hiatal hernia repair  12/15/2012    Procedure: LAPAROSCOPIC REPAIR OF HIATAL HERNIA;  Surgeon: [name], MD;  Location: [facility];  Service: General;;    Her Family History Is Significant For: Family History  Problem Relation Age of Onset  . Heart disease Father     Her Social History Is Significant For: History   Social History  . Marital Status: Married    Spouse Name: Lurline Hare  . Number of Children: 1  . Years of Education: 12   Occupational History  .      retired    Social History Main Topics  . Smoking status: Never Smoker   . Smokeless tobacco: Never Used  . Alcohol Use: No  . Drug Use: No  . Sexual Activity: Not on file   Other Topics Concern  . None   Social History Narrative    Her Allergies Are:  Allergies  Allergen Reactions  . Iodine   . Iohexol      Code: HIVES, Desc: reaction after a heart cath per pt--needs 13 hour prep, Onset Date: [id number]   :   Her Current Medications Are:  Outpatient Encounter Prescriptions as of 01/09/2015  Medication Sig  . albuterol (PROVENTIL HFA;VENTOLIN HFA) 108 (90 BASE) MCG/ACT inhaler Inhale 2 puffs into the lungs every 6 (six) hours as needed. For shortness of  breath  . ALPRAZolam (XANAX) 0.5 MG tablet Take 0.5 tablets (0.25 mg total) by mouth 2 (two) times daily as needed. For anxiety  . amoxicillin (AMOXIL) 500 MG capsule Taken when going to dentist  . carbidopa-levodopa-entacapone (STALEVO) 37.5-150-200 MG per tablet Take 1 tablet by mouth 5 (five) times daily. 7, 10, 1 PM, 4 PM and 7 PM  . citalopram (CELEXA) 40 MG tablet Take 1 tablet (40 mg total) by mouth every morning.  . ezetimibe (ZETIA) 10 MG tablet Take 10 mg by mouth at bedtime.   . fish oil-omega-3 fatty acids 1000 MG capsule Take 3 g by mouth daily.   [place]  fluticasone (FLONASE) 50 MCG/ACT nasal spray As needed  . HYDROcodone-acetaminophen (NORCO/VICODIN) 5-325 MG per tablet Take 1 tablet by mouth every 6 (six) hours as needed. For pain  . omeprazole (PRILOSEC) 40 MG capsule Take 1 capsule (40 mg total) by mouth every morning.  . prednisoLONE acetate (PRED FORTE) 1 % ophthalmic suspension   . PREVIDENT 5000 DRY MOUTH 1.1 % PSTE Place 1 application onto teeth 3 (three) times a week.   . traMADol (ULTRAM) 50 MG tablet Take 1 tablet by mouth as needed.  . TRAVATAN Z 0.004 % SOLN ophthalmic solution daily. Both eyes  . [DISCONTINUED] niacin 100 MG tablet Take 100 mg by mouth daily with breakfast.  :  Review of Systems:  Out of a complete 14 point review of systems, all are reviewed and negative with the exception of these symptoms as listed below:   Review of Systems  Neurological: Positive for weakness.       Spell of no energy   Objective:  Neurologic Exam  Physical Exam Physical Examination:   Filed Vitals:   01/09/15 1249  BP: 122/73  Pulse: 80  Temp: 96.6 F (35.9 C)    General Examination: The patient is a very pleasant 81 y.o. female in no acute distress.  HEENT: Normocephalic, atraumatic, pupils are equal, round and reactive to light and accommodation. Funduscopic exam is normal with sharp disc margins noted. Extraocular tracking shows mild saccadic breakdown without  nystagmus noted. There is limitation to upper gaze. There is mild decrease in eye blink rate. Hearing is intact.Face is symmetric with moderate facial masking and mild facial dyskinesias and normal facial sensation. There is no lip, neck or jaw tremor. Neck is moderately rigid with intact passive ROM. There are no carotid bruits on auscultation. Oropharynx exam reveals mild mouth dryness. No significant airway crowding is noted. Mallampati is class II. Tongue protrudes centrally and palate elevates symmetrically. There is no drooling. She has mild neck and facial dyskinesias.   Chest: is clear to auscultation without wheezing, rhonchi or crackles noted.  Heart: sounds are regular and normal without murmurs, rubs or gallops noted.   Abdomen: is soft, non-tender and non-distended with normal bowel sounds appreciated on auscultation.  Extremities: There is no pitting edema in the distal lower extremities bilaterally.   Skin: is warm and dry with no trophic changes noted.  Musculoskeletal: exam reveals ulnar deviation and joint deformities in keeping with OA in her hands.   Neurologically:  Mental status: The patient is awake and alert, paying good attention. She is able to completely provide the history. Her husband provides details. She is oriented to: person, place, time/date, situation, day of week, month of year and year. Her memory, attention, language and knowledge are intact. There is no aphasia, agnosia, apraxia or anomia. There is a mild degree of bradyphrenia. Speech is mild to moderately hypophonic with mild dysarthria noted. Mood is congruent and affect is normal.   Cranial nerves are as described above under HEENT exam. In addition, shoulder shrug is normal with equal shoulder height noted.  Motor exam: thin bulk, and normal strength for age is noted. There are mild generalized dyskinesias noted, which fairly constant. Tone is mildly rigid throughout, with mild cogwheeling noted in the  right upper extremity only. There is overall moderate bradykinesia. There is no drift or rebound. There is no tremor. Romberg is negative. Reflexes are trace in the upper extremities and trace in the lower extremities. Fine motor skills exam reveals: Finger taps  are moderately impaired on the right and mildly impaired on the left. Hand movements are moderately impaired on the right and mildly impaired on the left. RAP (rapid alternating patting) is moderately impaired on the right and mild to moderately impaired on the left. Foot taps are moderately impaired on the right and moderately impaired on the left. Foot agility (in the form of heel stomping) is moderately impaired on the right and moderately impaired on the left.    Cerebellar testing shows no dysmetria or intention tremor on finger to nose testing. Heel to shin is unremarkable bilaterally. There is no truncal or gait ataxia.   Sensory exam is intact to light touch, pinprick, vibration, temperature sense in the upper and lower extremities.   Gait, station and balance: She stands up from the seated position with mild difficulty but needs no assistance, and takes one attempt but pushes herself up from the chair. No veering to one side is noted. She is not noted to lean to the side. Posture is moderately stooped with scoliosis noted. Stance is wide-based. She walks with decrease in stride length an/d pace and decreased arm swing on the right. She uses a 4 pronged cane but is able to walk without the cane some. She turns in 3 steps and has some insecurity with turns but this seems to be better than last time. Tandem walk is not possible. Balance is mildly impaired.   Assessment and Plan:   In summary, NOUR RODRIGUES is a very pleasant 81 year old female with an underlying medical history of hyperlipidemia, type 2 diabetes, scoliosis, asthma, arthritis, chronic LBP on as needed hydrocodone (followed by Dr. Nelva Bush), and anxiety, who presents for  followup consultation of her right-sided predominant Parkinson's disease of approximately 9-years' duration, complicated by mild constipation, recurrent falls, and generalized dyskinesias which have been mild overall. Thankfully, she has not fallen recently but does have worsening of her balance and has noted worsening of her gait. She currently takes Stalevo 150 mg strength, 1 pill 5 times a day at 7, 10, 1, 4 and 7 PM. She feels that this has been helpful. She has not noticed any worsening of her dyskinesias. She has had some intermittent spells of low energy. This could be secondary to additional levodopa or secondary to autonomic dysregulation. These spells seem to come more typically in the mornings after breakfast which is classic for autonomic dysregulation in Parkinson's patients. I've asked her to sit back down when she has a spell like this, try to pump her legs to help with blood referral, maybe drink a sip of coffee or cola and stay well-hydrated, change positions slowly and use her cane at all times. We will continue with Stalevo 1 pill 5 times a day. I will see her back in 4 to 5 months, sooner if the need arises and have asked them to call us with any interim questions, concerns, problems, updates or refill requests.

## 2015-01-18 DIAGNOSIS — J301 Allergic rhinitis due to pollen: Secondary | ICD-10-CM | POA: Diagnosis not present

## 2015-01-18 DIAGNOSIS — J3089 Other allergic rhinitis: Secondary | ICD-10-CM | POA: Diagnosis not present

## 2015-01-25 ENCOUNTER — Ambulatory Visit (HOSPITAL_COMMUNITY)
Admission: RE | Admit: 2015-01-25 | Discharge: 2015-01-25 | Disposition: A | Discharge status: Home / Self Care | Payer: Medicare Other | Source: Ambulatory Visit | Attending: Internal Medicine | Admitting: Internal Medicine

## 2015-01-25 ENCOUNTER — Encounter (HOSPITAL_COMMUNITY): Payer: Self-pay

## 2015-01-25 DIAGNOSIS — M810 Age-related osteoporosis without current pathological fracture: Secondary | ICD-10-CM | POA: Insufficient documentation

## 2015-01-25 DIAGNOSIS — J3089 Other allergic rhinitis: Secondary | ICD-10-CM | POA: Diagnosis not present

## 2015-01-25 DIAGNOSIS — Z5181 Encounter for therapeutic drug level monitoring: Secondary | ICD-10-CM | POA: Diagnosis not present

## 2015-01-25 HISTORY — DX: Age-related osteoporosis without current pathological fracture: M81.0

## 2015-01-25 MED ORDER — DENOSUMAB 60 MG/ML ~~LOC~~ SOLN
60.0000 mg | Freq: Once | SUBCUTANEOUS | Status: AC
  Administered 2015-01-25: 60 mg via SUBCUTANEOUS
  Filled 2015-01-25: qty 1

## 2015-01-25 NOTE — Discharge Instructions (Signed)
PROLIA °Denosumab injection °What is this medicine? °DENOSUMAB (den oh sue mab) slows bone breakdown. Prolia is used to treat osteoporosis in women after menopause and in men. Xgeva is used to prevent bone fractures and other bone problems caused by cancer bone metastases. Xgeva is also used to treat giant cell tumor of the bone. °This medicine may be used for other purposes; ask your health care provider or pharmacist if you have questions. °COMMON BRAND NAME(S): Prolia, XGEVA °What should I tell my health care provider before I take this medicine? °They need to know if you have any of these conditions: °-dental disease °-eczema °-infection or history of infections °-kidney disease or on dialysis °-low blood calcium or vitamin D °-malabsorption syndrome °-scheduled to have surgery or tooth extraction °-taking medicine that contains denosumab °-thyroid or parathyroid disease °-an unusual reaction to denosumab, other medicines, foods, dyes, or preservatives °-pregnant or trying to get pregnant °-breast-feeding °How should I use this medicine? °This medicine is for injection under the skin. It is given by a health care professional in a hospital or clinic setting. °If you are getting Prolia, a special MedGuide will be given to you by the pharmacist with each prescription and refill. Be sure to read this information carefully each time. °For Prolia, talk to your pediatrician regarding the use of this medicine in children. Special care may be needed. For Xgeva, talk to your pediatrician regarding the use of this medicine in children. While this drug may be prescribed for children as young as 13 years for selected conditions, precautions do apply. °Overdosage: If you think you've taken too much of this medicine contact a poison control center or emergency room at once. °Overdosage: If you think you have taken too much of this medicine contact a poison control center or emergency room at once. °NOTE: This medicine is only  for you. Do not share this medicine with others. °What if I miss a dose? °It is important not to miss your dose. Call your doctor or health care professional if you are unable to keep an appointment. °What may interact with this medicine? °Do not take this medicine with any of the following medications: °-other medicines containing denosumab °This medicine may also interact with the following medications: °-medicines that suppress the immune system °-medicines that treat cancer °-steroid medicines like prednisone or cortisone °This list may not describe all possible interactions. Give your health care provider a list of all the medicines, herbs, non-prescription drugs, or dietary supplements you use. Also tell them if you smoke, drink alcohol, or use illegal drugs. Some items may interact with your medicine. °What should I watch for while using this medicine? °Visit your doctor or health care professional for regular checks on your progress. Your doctor or health care professional may order blood tests and other tests to see how you are doing. °Call your doctor or health care professional if you get a cold or other infection while receiving this medicine. Do not treat yourself. This medicine may decrease your body's ability to fight infection. °You should make sure you get enough calcium and vitamin D while you are taking this medicine, unless your doctor tells you not to. Discuss the foods you eat and the vitamins you take with your health care professional. °See your dentist regularly. Brush and floss your teeth as directed. Before you have any dental work done, tell your dentist you are receiving this medicine. °Do not become pregnant while taking this medicine or for 5 months after   stopping it. Women should inform their doctor if they wish to become pregnant or think they might be pregnant. There is a potential for serious side effects to an unborn child. Talk to your health care professional or pharmacist for  more information. What side effects may I notice from receiving this medicine? Side effects that you should report to your doctor or health care professional as soon as possible: -allergic reactions like skin rash, itching or hives, swelling of the face, lips, or tongue -breathing problems -chest pain -fast, irregular heartbeat -feeling faint or lightheaded, falls -fever, chills, or any other sign of infection -muscle spasms, tightening, or twitches -numbness or tingling -skin blisters or bumps, or is dry, peels, or red -slow healing or unexplained pain in the mouth or jaw -unusual bleeding or bruising Side effects that usually do not require medical attention (Report these to your doctor or health care professional if they continue or are bothersome.): -muscle pain -stomach upset, gas This list may not describe all possible side effects. Call your doctor for medical advice about side effects. You may report side effects to FDA at 1-800-FDA-1088. Where should I keep my medicine? This medicine is only given in a clinic, doctor's office, or other health care setting and will not be stored at home. NOTE: This sheet is a summary. It may not cover all possible information. If you have questions about this medicine, talk to your doctor, pharmacist, or health care provider.  2015, [employer]. (2012-04-27 12:37:47)

## 2015-02-01 DIAGNOSIS — J3089 Other allergic rhinitis: Secondary | ICD-10-CM | POA: Diagnosis not present

## 2015-02-07 DIAGNOSIS — J3089 Other allergic rhinitis: Secondary | ICD-10-CM | POA: Diagnosis not present

## 2015-02-13 DIAGNOSIS — J3089 Other allergic rhinitis: Secondary | ICD-10-CM | POA: Diagnosis not present

## 2015-02-20 DIAGNOSIS — J3089 Other allergic rhinitis: Secondary | ICD-10-CM | POA: Diagnosis not present

## 2015-03-01 DIAGNOSIS — J3089 Other allergic rhinitis: Secondary | ICD-10-CM | POA: Diagnosis not present

## 2015-03-07 DIAGNOSIS — J3089 Other allergic rhinitis: Secondary | ICD-10-CM | POA: Diagnosis not present

## 2015-03-10 ENCOUNTER — Other Ambulatory Visit: Payer: Self-pay | Admitting: Neurology

## 2015-03-10 NOTE — Telephone Encounter (Signed)
Pharmacy indicates patient is requesting a refill on Alprazolam.  Last prescribed by [place] at [facility] in Jan 2015.  Would you like to refill?

## 2015-03-13 ENCOUNTER — Telehealth: Payer: Self-pay

## 2015-03-13 DIAGNOSIS — H4011X1 Primary open-angle glaucoma, mild stage: Secondary | ICD-10-CM | POA: Diagnosis not present

## 2015-03-13 NOTE — Telephone Encounter (Signed)
I spoke to patient this morning to inform her that her Xanax is ready for p/u/. She asked for it to be faxed to [facility] due to transportation issues. Rx faxed today.

## 2015-03-15 DIAGNOSIS — J3089 Other allergic rhinitis: Secondary | ICD-10-CM | POA: Diagnosis not present

## 2015-03-21 DIAGNOSIS — J301 Allergic rhinitis due to pollen: Secondary | ICD-10-CM | POA: Diagnosis not present

## 2015-03-21 DIAGNOSIS — J3089 Other allergic rhinitis: Secondary | ICD-10-CM | POA: Diagnosis not present

## 2015-04-03 ENCOUNTER — Other Ambulatory Visit: Payer: Self-pay | Admitting: Allergy

## 2015-04-03 ENCOUNTER — Ambulatory Visit
Admission: RE | Admit: 2015-04-03 | Discharge: 2015-04-03 | Disposition: A | Discharge status: Home / Self Care | Payer: Medicare Other | Source: Ambulatory Visit | Attending: Allergy | Admitting: Allergy

## 2015-04-03 DIAGNOSIS — J209 Acute bronchitis, unspecified: Secondary | ICD-10-CM | POA: Diagnosis not present

## 2015-04-03 DIAGNOSIS — R05 Cough: Secondary | ICD-10-CM | POA: Diagnosis not present

## 2015-04-03 DIAGNOSIS — R0602 Shortness of breath: Secondary | ICD-10-CM | POA: Diagnosis not present

## 2015-04-03 DIAGNOSIS — J3089 Other allergic rhinitis: Secondary | ICD-10-CM | POA: Diagnosis not present

## 2015-04-03 DIAGNOSIS — H1045 Other chronic allergic conjunctivitis: Secondary | ICD-10-CM | POA: Diagnosis not present

## 2015-04-03 DIAGNOSIS — J4520 Mild intermittent asthma, uncomplicated: Secondary | ICD-10-CM | POA: Diagnosis not present

## 2015-04-05 ENCOUNTER — Telehealth: Payer: Self-pay | Admitting: Cardiology

## 2015-04-05 DIAGNOSIS — G20 Parkinson's disease: Secondary | ICD-10-CM | POA: Diagnosis not present

## 2015-04-05 DIAGNOSIS — K2270 Barrett's esophagus without dysplasia: Secondary | ICD-10-CM | POA: Diagnosis not present

## 2015-04-05 DIAGNOSIS — Z681 Body mass index (BMI) 19 or less, adult: Secondary | ICD-10-CM | POA: Diagnosis not present

## 2015-04-05 DIAGNOSIS — J45909 Unspecified asthma, uncomplicated: Secondary | ICD-10-CM | POA: Diagnosis not present

## 2015-04-05 DIAGNOSIS — R0601 Orthopnea: Secondary | ICD-10-CM | POA: Diagnosis not present

## 2015-04-05 DIAGNOSIS — J810 Acute pulmonary edema: Secondary | ICD-10-CM | POA: Diagnosis not present

## 2015-04-05 DIAGNOSIS — R5383 Other fatigue: Secondary | ICD-10-CM | POA: Diagnosis not present

## 2015-04-05 NOTE — Telephone Encounter (Signed)
Received records from [facility] for appointment on 04/06/15 with Dr Martinique.  Records given to [facility] (medical records) for Dr Doug Sou schedule on 04/06/15. lp

## 2015-04-05 NOTE — Telephone Encounter (Signed)
DR AVVA CALLED.   HAS A PATIENT IN OFFICE TODAY. WOULD AN APPOINTMENT WITH AN CARDIOLOGIST SOMETIME IN THE NEXT WEEK. PATIENT SAW ALLERGIST (DR Donneta Romberg) RECENT AND THOUGHT ALLERGIES DID CXRAY.  XRAY SHOWED VOLUME OVERLOAD. DR AVVA  WILL DO LABS TODAY NO HISTORY OF HEART FAILURE Jill Hamilton  SAW DR Einar Gip YEARS AGO WHEN HE WAS APART OF [place] Kitchen  PATIENT'S HUSBAND IS AN ESTABLISH  PATIENTWITH DR HARDING. ATTEMPT TO MAKE APPOINTMENT WITH DR HARDING, BUT DR HARDING WILL NOT BE IN OFFICE UNTIL 04/18/15.  DR AVVA WOULD LIKE FOR PATIENT TO BE SEEN SOONER. APPOINTMENT MADE FOR 05/07/2015 AT 8:15 AM.-INFORMATION GIVEN TO AMY -DR AVVA'S NURSE SHE WILL CALL PATIENT. INFORMATION FAXED AN PLACED ON   THE DESK OF DR [facility] NURSE.

## 2015-04-06 ENCOUNTER — Ambulatory Visit (HOSPITAL_COMMUNITY)
Admission: RE | Admit: 2015-04-06 | Discharge: 2015-04-06 | Disposition: A | Discharge status: Home / Self Care | Payer: Medicare Other | Source: Ambulatory Visit | Attending: Cardiology | Admitting: Cardiology

## 2015-04-06 ENCOUNTER — Ambulatory Visit (INDEPENDENT_AMBULATORY_CARE_PROVIDER_SITE_OTHER): Payer: Medicare Other | Admitting: Cardiology

## 2015-04-06 ENCOUNTER — Encounter: Payer: Self-pay | Admitting: Cardiology

## 2015-04-06 VITALS — BP 120/62 | HR 82 | Ht 62.0 in | Wt 108.4 lb

## 2015-04-06 DIAGNOSIS — I340 Nonrheumatic mitral (valve) insufficiency: Secondary | ICD-10-CM | POA: Diagnosis not present

## 2015-04-06 DIAGNOSIS — R011 Cardiac murmur, unspecified: Secondary | ICD-10-CM | POA: Diagnosis not present

## 2015-04-06 DIAGNOSIS — I272 Other secondary pulmonary hypertension: Secondary | ICD-10-CM | POA: Insufficient documentation

## 2015-04-06 DIAGNOSIS — I358 Other nonrheumatic aortic valve disorders: Secondary | ICD-10-CM | POA: Insufficient documentation

## 2015-04-06 DIAGNOSIS — I5031 Acute diastolic (congestive) heart failure: Secondary | ICD-10-CM | POA: Diagnosis not present

## 2015-04-06 DIAGNOSIS — I509 Heart failure, unspecified: Secondary | ICD-10-CM | POA: Diagnosis present

## 2015-04-06 DIAGNOSIS — E785 Hyperlipidemia, unspecified: Secondary | ICD-10-CM

## 2015-04-06 DIAGNOSIS — I071 Rheumatic tricuspid insufficiency: Secondary | ICD-10-CM | POA: Diagnosis not present

## 2015-04-06 DIAGNOSIS — J3089 Other allergic rhinitis: Secondary | ICD-10-CM | POA: Diagnosis not present

## 2015-04-06 MED ORDER — FUROSEMIDE 40 MG PO TABS: 40.0000 mg | ORAL_TABLET | Freq: Two times a day (BID) | ORAL | Status: DC

## 2015-04-06 MED ORDER — POTASSIUM CHLORIDE CRYS ER 20 MEQ PO TBCR: 20.0000 meq | EXTENDED_RELEASE_TABLET | Freq: Two times a day (BID) | ORAL | Status: DC

## 2015-04-06 NOTE — Patient Instructions (Signed)
Increase lasix to 40 mg twice a day  Add potassium 20 meq twice a day  We will schedule you for an Echocardiogram  Restrict your salt intake.  We will get your lab work from Dr. Dagmar Hait  We will get lab work in one week and arrange follow up in 1-2 weeks.

## 2015-04-06 NOTE — Progress Notes (Signed)
Cardiology Office Note   Date:  04/06/2015   ID:  Jill Hamilton, DOB 12-Jan-1934, MRN [id number]  PCP:  [name], MD  Cardiologist:   Peter Martinique, MD   Chief Complaint  Patient presents with  . Establish Care    dizziness, shortness of breath.  fluid overload possible CHF      History of Present Illness: Jill Hamilton is a 81 y.o. female who presents for evaluation of dyspnea at the request of Dr. Dagmar Hait. She reports a 6 month history of decreased energy. One week ago she developed abrupt onset of dyspnea at rest associated with cough and some hemoptysis. Weight is up 4 lbs. She has some edema. CXR obtained that showed CHF with bilateral pleural effusions. Started on lasix 20 mg daily and a steroid taper without improvement. Complains of orthopnea and PND. No prior history of CHF. Reports cardiac cath 20 yrs ago that was OK. Has long history of murmur. She does have a history of asthma but this has not been active for 2 years. No palpitations. No dizziness or syncope.     Past Medical History  Diagnosis Date  . Hyperlipidemia   . Parkinson's disease   . Scoliosis   . Asthma   . Arthritis   . Anxiety   . Osteoporosis   . H/O seasonal allergies     Past Surgical History  Procedure Laterality Date  . Abdominal hysterectomy    . Cholecystectomy    . Carpal tunnel release      bilateral  . Hammer toe surgery      bilateral  . Esophagogastroduodenoscopy  08/12/12  . Appendectomy      with hysterectomy  . Bladder surgery      bladder tacked  . Hiatal hernia repair  12/15/2012    laparascopic repair  . Hiatal hernia repair  12/15/2012    Procedure: LAPAROSCOPIC REPAIR OF HIATAL HERNIA;  Surgeon: [name], MD;  Location: [facility];  Service: General;;     Current Outpatient Prescriptions  Medication Sig Dispense Refill  . albuterol (PROVENTIL HFA;VENTOLIN HFA) 108 (90 BASE) MCG/ACT inhaler Inhale 2 puffs into the lungs every 6 (six) hours as needed. For  shortness of breath    . ALPRAZolam (XANAX) 0.5 MG tablet Take 0.5 tablets (0.25 mg total) by mouth 2 (two) times daily as needed for anxiety. 30 tablet 3  . amoxicillin (AMOXIL) 500 MG capsule Taken when going to dentist    . carbidopa-levodopa-entacapone (STALEVO) 37.5-150-200 MG per tablet TAKE ONE TABLET FIVE TIMES A DAY. 7AM, 10AM, 1PM, AT 4 PM, AND 7PM 150 tablet 3  . citalopram (CELEXA) 40 MG tablet Take 1 tablet (40 mg total) by mouth every morning. 90 tablet 3  . ezetimibe (ZETIA) 10 MG tablet Take 10 mg by mouth at bedtime.     . fish oil-omega-3 fatty acids 1000 MG capsule Take 3 g by mouth daily.     . fluticasone (FLONASE) 50 MCG/ACT nasal spray As needed  6  . HYDROcodone-acetaminophen (NORCO/VICODIN) 5-325 MG per tablet Take 1 tablet by mouth every 6 (six) hours as needed. For pain    . omeprazole (PRILOSEC) 40 MG capsule Take 1 capsule (40 mg total) by mouth every morning. 80 capsule 3  . prednisoLONE acetate (PRED FORTE) 1 % ophthalmic suspension     . PREVIDENT 5000 DRY MOUTH 1.1 % PSTE Place 1 application onto teeth 3 (three) times a week.     . traMADol (  ULTRAM) 50 MG tablet Take 1 tablet by mouth as needed.    . TRAVATAN Z 0.004 % SOLN ophthalmic solution daily. Both eyes    . brimonidine (ALPHAGAN) 0.15 % ophthalmic solution Place 1 drop into both eyes daily.  6  . furosemide (LASIX) 40 MG tablet Take 1 tablet (40 mg total) by mouth 2 (two) times daily. 60 tablet 6  . potassium chloride SA (K-DUR,KLOR-CON) 20 MEQ tablet Take 1 tablet (20 mEq total) by mouth 2 (two) times daily. 60 tablet 6  . predniSONE (DELTASONE) 10 MG tablet Take 10 tablets by mouth taper from 4 doses each day to 1 dose and stop.  0  . PREVIDENT 5000 DRY MOUTH 1.1 % GEL dental gel Take 1 mL by mouth See admin instructions.  0   No current facility-administered medications for this visit.    Allergies:   Iodine and Iohexol    Social History:  The patient  reports that she has never smoked. She has  never used smokeless tobacco. She reports that she does not drink alcohol or use illicit drugs.   Family History:  The patient's family history includes Heart disease in her father.    ROS:  Please see the history of present illness.   Otherwise, review of systems are positive for none.   All other systems are reviewed and negative.    PHYSICAL EXAM: VS:  BP 120/62 mmHg  Pulse 82  Ht 5\' 2"  (1.575 m)  Wt 49.17 kg (108 lb 6.4 oz)  BMI 19.82 kg/m2 , BMI Body mass index is 19.82 kg/(m^2). GEN: elderly WF in mild respiratory distress. She appears frail.  HEENT: normal Neck: + JVD to the jaw at 30 degrees, No carotid bruits, or masses Cardiac: RRR;  Normal S1-2. Loud 3/6 holosystolic murmur at the apex radiating to left axilla.  Respiratory:  Bibasilar rales with diminished BS.  GI: soft, nontender, nondistended, + BS MS: no deformity or atrophy Skin: warm and dry, no rash. Mild edema. Neuro:  Strength and sensation are intact Psych: euthymic mood, full affect   EKG:  EKG is ordered today. The ekg ordered today demonstrates NSR with PACs. Rate 82, ST-T wave abnormality consistent with inferolateral ischemia.    Recent Labs: No results found for requested labs within last 365 days.    Lipid Panel No results found for: CHOL, TRIG, HDL, CHOLHDL, VLDL, LDLCALC, LDLDIRECT    Wt Readings from Last 3 Encounters:  04/06/15 49.17 kg (108 lb 6.4 oz)  01/09/15 48.081 kg (106 lb)  09/01/14 48.535 kg (107 lb)      Other studies Reviewed: Additional studies/ records that were reviewed today include: Office records of Dr. Dagmar Hait. Review of the above records demonstrates:  Labs from 04/05/15: BUN 41 creatinine 1.0. Other chemistries are normal. CBC normal.    ASSESSMENT AND PLAN:  1.  Acute CHF - etiology most likely related to valvular heart disease with MR murmur noted on exam. Need to assess LV function. Is still volume overloaded. Will increase lasix to 40 mg bid. Start potassium 20  meq bid. Sodium restriction. Will arrange Echo. Follow up in 1-2 weeks with OV, repeat BMET and BNP. Depending on results of Echo may need right and left heart cath. 2. Heart murmur- exam is consistent with severe MR. Await Echo results. May need cardiac cath if this is confirmed to see if she needs MV repair. 3. CAD. Apparent negative cath 20 yrs ago but chest CT in 2010 showed significant  coronary calcification. Inferolateral ischemia by Ecg. No chest pain. 4. Asthma. 5. Hyperlipidemia. On Zetia.    Current medicines are reviewed at length with the patient today.  The patient does not have concerns regarding medicines.  The following changes have been made:  See above  Labs/ tests ordered today include:  Orders Placed This Encounter  Procedures  . Basic metabolic panel  . B Nat Peptide  . EKG 12-Lead  . ECHOCARDIOGRAM COMPLETE     Disposition:   FU in 1-2 weeks.  Signed, Peter Martinique, [place]  04/06/2015 1:23 PM    [facility] [address], [place], [place], [address] Phone [phone], Fax [phone]

## 2015-04-11 ENCOUNTER — Telehealth: Payer: Self-pay | Admitting: Cardiology

## 2015-04-11 NOTE — Telephone Encounter (Signed)
Staff message sent to Dr.  Concerning her wt. and Lasix

## 2015-04-11 NOTE — Telephone Encounter (Signed)
Jill Hamilton is calling to find out if she needs to cut back on her medication Lasik  Please call    Thanks

## 2015-04-13 ENCOUNTER — Ambulatory Visit (INDEPENDENT_AMBULATORY_CARE_PROVIDER_SITE_OTHER): Payer: Medicare Other | Admitting: Cardiology

## 2015-04-13 ENCOUNTER — Encounter: Payer: Self-pay | Admitting: Cardiology

## 2015-04-13 VITALS — BP 94/69 | HR 76 | Wt 97.5 lb

## 2015-04-13 DIAGNOSIS — J3089 Other allergic rhinitis: Secondary | ICD-10-CM | POA: Diagnosis not present

## 2015-04-13 DIAGNOSIS — I5031 Acute diastolic (congestive) heart failure: Secondary | ICD-10-CM | POA: Diagnosis not present

## 2015-04-13 DIAGNOSIS — I340 Nonrheumatic mitral (valve) insufficiency: Secondary | ICD-10-CM | POA: Diagnosis not present

## 2015-04-13 DIAGNOSIS — I341 Nonrheumatic mitral (valve) prolapse: Secondary | ICD-10-CM | POA: Diagnosis not present

## 2015-04-13 MED ORDER — FUROSEMIDE 40 MG PO TABS: 40.0000 mg | ORAL_TABLET | Freq: Every day | ORAL | Status: AC

## 2015-04-13 NOTE — Patient Instructions (Signed)
Reduce lasix to 40 mg daily  We will check some lab work   I will see you in 2 months.

## 2015-04-13 NOTE — Progress Notes (Signed)
Cardiology Office Note   Date:  04/13/2015   ID:  Jill Hamilton, DOB October 07, 1934, MRN [id number]  PCP:  [name], MD  Cardiologist:   Peter Martinique, MD   Chief Complaint  Patient presents with  . Follow-up    light headed and dizzy      History of Present Illness: Jill Hamilton is a 81 y.o. female is seen for follow up of CHF.  She was seen last week with new onset CHF and a murmur. We increased lasix to 40 mg bid and she had an excellent diuretic response. Her breathing is completely back to baseline. She is able to lie flat. No edema. Lost 10 lbs in one week. She does now feel a little lightheaded. Overall feels much better. Echo results noted below.   Past Medical History  Diagnosis Date  . Hyperlipidemia   . Parkinson's disease   . Scoliosis   . Asthma   . Arthritis   . Anxiety   . Osteoporosis   . H/O seasonal allergies   . Mitral valve prolapse   . Moderate mitral insufficiency     Past Surgical History  Procedure Laterality Date  . Abdominal hysterectomy    . Cholecystectomy    . Carpal tunnel release      bilateral  . Hammer toe surgery      bilateral  . Esophagogastroduodenoscopy  08/12/12  . Appendectomy      with hysterectomy  . Bladder surgery      bladder tacked  . Hiatal hernia repair  12/15/2012    laparascopic repair  . Hiatal hernia repair  12/15/2012    Procedure: LAPAROSCOPIC REPAIR OF HIATAL HERNIA;  Surgeon: [name], MD;  Location: [facility];  Service: General;;     Current Outpatient Prescriptions  Medication Sig Dispense Refill  . albuterol (PROVENTIL HFA;VENTOLIN HFA) 108 (90 BASE) MCG/ACT inhaler Inhale 2 puffs into the lungs every 6 (six) hours as needed. For shortness of breath    . ALPRAZolam (XANAX) 0.5 MG tablet Take 0.5 tablets (0.25 mg total) by mouth 2 (two) times daily as needed for anxiety. 30 tablet 3  . amoxicillin (AMOXIL) 500 MG capsule Taken when going to dentist    . brimonidine (ALPHAGAN) 0.15 %  ophthalmic solution Place 1 drop into both eyes daily.  6  . carbidopa-levodopa-entacapone (STALEVO) 37.5-150-200 MG per tablet TAKE ONE TABLET FIVE TIMES A DAY. 7AM, 10AM, 1PM, AT 4 PM, AND 7PM 150 tablet 3  . citalopram (CELEXA) 40 MG tablet Take 1 tablet (40 mg total) by mouth every morning. 90 tablet 3  . ezetimibe (ZETIA) 10 MG tablet Take 10 mg by mouth at bedtime.     . fish oil-omega-3 fatty acids 1000 MG capsule Take 3 g by mouth daily.     . fluticasone (FLONASE) 50 MCG/ACT nasal spray As needed  6  . furosemide (LASIX) 40 MG tablet Take 1 tablet (40 mg total) by mouth daily. 60 tablet 6  . HYDROcodone-acetaminophen (NORCO/VICODIN) 5-325 MG per tablet Take 1 tablet by mouth every 6 (six) hours as needed. For pain    . omeprazole (PRILOSEC) 40 MG capsule Take 1 capsule (40 mg total) by mouth every morning. 80 capsule 3  . potassium chloride SA (K-DUR,KLOR-CON) 20 MEQ tablet Take 1 tablet (20 mEq total) by mouth 2 (two) times daily. 60 tablet 6  . prednisoLONE acetate (PRED FORTE) 1 % ophthalmic suspension     . predniSONE (DELTASONE) 10  MG tablet Take 10 tablets by mouth taper from 4 doses each day to 1 dose and stop.  0  . PREVIDENT 5000 DRY MOUTH 1.1 % GEL dental gel Take 1 mL by mouth See admin instructions.  0  . PREVIDENT 5000 DRY MOUTH 1.1 % PSTE Place 1 application onto teeth 3 (three) times a week.     . traMADol (ULTRAM) 50 MG tablet Take 1 tablet by mouth as needed.    . TRAVATAN Z 0.004 % SOLN ophthalmic solution daily. Both eyes     No current facility-administered medications for this visit.    Allergies:   Iodine and Iohexol    Social History:  The patient  reports that she has never smoked. She has never used smokeless tobacco. She reports that she does not drink alcohol or use illicit drugs.   Family History:  The patient's family history includes Heart disease in her father.    ROS:  Please see the history of present illness.      All other systems are reviewed  and negative.    PHYSICAL EXAM: VS:  BP 94/69 mmHg  Pulse 76  Wt 44.226 kg (97 lb 8 oz) , BMI Body mass index is 17.83 kg/(m^2). GEN: elderly WF in no distress. She appears frail.  HEENT: normal Neck: no JVD. No carotid bruits, or masses Cardiac: RRR;  Normal S1-2. Loud 3/6 holosystolic murmur at the apex radiating to left axilla.  Respiratory:  Clear GI: soft, nontender, nondistended, + BS MS: no deformity or atrophy Skin: warm and dry, no rash. No edema. Neuro:  Strength and sensation are intact Psych: euthymic mood, full affect   EKG:  EKG is not ordered today.ave abnormality consistent with inferolateral ischemia.    Recent Labs: No results found for requested labs within last 365 days.    Lipid Panel No results found for: CHOL, TRIG, HDL, CHOLHDL, VLDL, LDLCALC, LDLDIRECT    Wt Readings from Last 3 Encounters:  04/13/15 44.226 kg (97 lb 8 oz)  04/06/15 49.17 kg (108 lb 6.4 oz)  01/09/15 48.081 kg (106 lb)      Other studies Reviewed: Echo 04/06/15:Study Conclusions  - Left ventricle: The cavity size was normal. Wall thickness was normal. Systolic function was normal. The estimated ejection fraction was in the range of 60% to 65%. Wall motion was normal; there were no regional wall motion abnormalities. Left ventricular diastolic function parameters were normal. - Aortic valve: Trileaflet; mildly calcified leaflets, especially the non-coronary cusp. There was no regurgitation. - Mitral valve: Mildly thickened leaflets. Mild stenosis due to leaflet prolapse. There is a flail chord of the anterior leaflet, seen prolapsing with the leaflet across the valve plane. Thare is moderate to severe posteriorly directed regurgitation. Valve area by continuity equation (using LVOT flow): 1.98 cm^2. - Left atrium: Severely dilated at 65 ml/m2. - Right ventricle: The cavity size was mildly dilated. - Right atrium: The atrium was mildly dilated. - Tricuspid  valve: There was moderate regurgitation. - Pulmonary arteries: PA peak pressure: 64 mm Hg (S). - Inferior vena cava: The vessel was normal in size. The respirophasic diameter changes were in the normal range (= 50%), consistent with normal central venous pressure.  Impressions:  - LVEF 60-65%, normal wall motion, normal wall thickness, thickened mitral leaflets with a flail segment of the anterior leaflet and possible mass of the leaflet, with flail chord that prolapses in and out of the valve plane. There is moderate to severe, posteriorly directed regurgitation.  There is calcification of the non-coronary cuse of the aortic valve, without stenosis. There is moderate TR with moderate pulmonary hypertension at 64 mmHg.    ASSESSMENT AND PLAN:  1.  Acute CHF - secondary  to valvular heart disease with mitral insufficiency. LV function is normal. Excellent response to diuresis. Now mildly hypotensive. Will reduce lasix to 40 mg daily. Continue potassium. Repeat BMET and BNP.  2. 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 Ideally we would consider MV repair but she is a frail, thin elderly female with decreased functional status, parkinson's disease, and scoliosis. I think that she would be high risk for surgical repair. Given her good response to diuretic therapy I am hopeful that she can be managed medically. Will need close follow up and repeat assessment of Echo findings in 4-6 months. If her clinical status deteriorates then we will need to proceed with right and left heart cath and TEE to consider her suitability for repair.  3. CAD. Apparent negative cath 20 yrs ago but chest CT in 2010 showed significant coronary calcification. Inferolateral ischemia by Ecg. No chest pain. 4. Asthma. 5. Hyperlipidemia. On Zetia.    Current medicines are reviewed at length with the patient today.  The patient does not have concerns regarding  medicines.  The following changes have been made:  See above  Labs/ tests ordered today include:   Orders Placed This Encounter  Procedures  . Basic metabolic panel  . B Nat Peptide     Disposition:   FU in 2 months.  Signed, Peter Martinique, [place]  04/13/2015 9:56 PM    [facility] [address], [place], [place], [address] Phone [phone], Fax [phone]

## 2015-04-17 DIAGNOSIS — G894 Chronic pain syndrome: Secondary | ICD-10-CM | POA: Diagnosis not present

## 2015-04-17 DIAGNOSIS — M5136 Other intervertebral disc degeneration, lumbar region: Secondary | ICD-10-CM | POA: Diagnosis not present

## 2015-04-17 DIAGNOSIS — Z79899 Other long term (current) drug therapy: Secondary | ICD-10-CM | POA: Diagnosis not present

## 2015-04-17 DIAGNOSIS — I340 Nonrheumatic mitral (valve) insufficiency: Secondary | ICD-10-CM | POA: Diagnosis not present

## 2015-04-17 DIAGNOSIS — I5031 Acute diastolic (congestive) heart failure: Secondary | ICD-10-CM | POA: Diagnosis not present

## 2015-04-17 DIAGNOSIS — Z79891 Long term (current) use of opiate analgesic: Secondary | ICD-10-CM | POA: Diagnosis not present

## 2015-04-17 DIAGNOSIS — I341 Nonrheumatic mitral (valve) prolapse: Secondary | ICD-10-CM | POA: Diagnosis not present

## 2015-04-17 DIAGNOSIS — I509 Heart failure, unspecified: Secondary | ICD-10-CM | POA: Diagnosis not present

## 2015-04-17 DIAGNOSIS — I348 Other nonrheumatic mitral valve disorders: Secondary | ICD-10-CM | POA: Diagnosis not present

## 2015-04-17 DIAGNOSIS — J3089 Other allergic rhinitis: Secondary | ICD-10-CM | POA: Diagnosis not present

## 2015-04-18 ENCOUNTER — Telehealth: Payer: Self-pay | Admitting: *Deleted

## 2015-04-18 LAB — BASIC METABOLIC PANEL
BUN: 29 mg/dL — ABNORMAL HIGH (ref 6–23)
CHLORIDE: 103 meq/L (ref 96–112)
CO2: 34 meq/L — AB (ref 19–32)
CREATININE: 1 mg/dL (ref 0.50–1.10)
Calcium: 8.3 mg/dL — ABNORMAL LOW (ref 8.4–10.5)
GLUCOSE: 98 mg/dL (ref 70–99)
POTASSIUM: 4.7 meq/L (ref 3.5–5.3)
SODIUM: 139 meq/L (ref 135–145)

## 2015-04-18 LAB — BRAIN NATRIURETIC PEPTIDE: BRAIN NATRIURETIC PEPTIDE: 283.9 pg/mL — AB (ref 0.0–100.0)

## 2015-04-18 NOTE — Telephone Encounter (Signed)
Spoke to patient. Result given . Verbalized understanding  

## 2015-04-18 NOTE — Telephone Encounter (Signed)
-----   Message from Peter M Martinique, MD sent at 04/18/2015  4:37 PM EDT ----- Renal function and electrolytes are good. BNP has improved.  Peter Martinique MD, [facility]

## 2015-04-21 ENCOUNTER — Ambulatory Visit: Payer: Medicare Other | Admitting: Cardiology

## 2015-04-24 DIAGNOSIS — J3089 Other allergic rhinitis: Secondary | ICD-10-CM | POA: Diagnosis not present

## 2015-05-01 DIAGNOSIS — J3089 Other allergic rhinitis: Secondary | ICD-10-CM | POA: Diagnosis not present

## 2015-05-02 DIAGNOSIS — J3089 Other allergic rhinitis: Secondary | ICD-10-CM | POA: Diagnosis not present

## 2015-05-02 DIAGNOSIS — J301 Allergic rhinitis due to pollen: Secondary | ICD-10-CM | POA: Diagnosis not present

## 2015-05-08 DIAGNOSIS — J3089 Other allergic rhinitis: Secondary | ICD-10-CM | POA: Diagnosis not present

## 2015-05-16 DIAGNOSIS — J3089 Other allergic rhinitis: Secondary | ICD-10-CM | POA: Diagnosis not present

## 2015-05-22 DIAGNOSIS — H4011X1 Primary open-angle glaucoma, mild stage: Secondary | ICD-10-CM | POA: Diagnosis not present

## 2015-05-23 DIAGNOSIS — Z681 Body mass index (BMI) 19 or less, adult: Secondary | ICD-10-CM | POA: Diagnosis not present

## 2015-05-23 DIAGNOSIS — G20 Parkinson's disease: Secondary | ICD-10-CM | POA: Diagnosis not present

## 2015-05-23 DIAGNOSIS — E780 Pure hypercholesterolemia: Secondary | ICD-10-CM | POA: Diagnosis not present

## 2015-05-23 DIAGNOSIS — R634 Abnormal weight loss: Secondary | ICD-10-CM | POA: Diagnosis not present

## 2015-05-23 DIAGNOSIS — M810 Age-related osteoporosis without current pathological fracture: Secondary | ICD-10-CM | POA: Diagnosis not present

## 2015-05-23 DIAGNOSIS — I340 Nonrheumatic mitral (valve) insufficiency: Secondary | ICD-10-CM | POA: Diagnosis not present

## 2015-05-23 DIAGNOSIS — J810 Acute pulmonary edema: Secondary | ICD-10-CM | POA: Diagnosis not present

## 2015-05-23 DIAGNOSIS — J3089 Other allergic rhinitis: Secondary | ICD-10-CM | POA: Diagnosis not present

## 2015-05-23 DIAGNOSIS — F4321 Adjustment disorder with depressed mood: Secondary | ICD-10-CM | POA: Diagnosis not present

## 2015-05-24 DIAGNOSIS — E780 Pure hypercholesterolemia: Secondary | ICD-10-CM | POA: Diagnosis not present

## 2015-05-30 DIAGNOSIS — J3089 Other allergic rhinitis: Secondary | ICD-10-CM | POA: Diagnosis not present

## 2015-06-07 DIAGNOSIS — J3089 Other allergic rhinitis: Secondary | ICD-10-CM | POA: Diagnosis not present

## 2015-06-12 ENCOUNTER — Ambulatory Visit: Payer: Medicare Other | Admitting: Neurology

## 2015-06-13 DIAGNOSIS — J3089 Other allergic rhinitis: Secondary | ICD-10-CM | POA: Diagnosis not present

## 2015-06-13 DIAGNOSIS — J301 Allergic rhinitis due to pollen: Secondary | ICD-10-CM | POA: Diagnosis not present

## 2015-06-20 ENCOUNTER — Other Ambulatory Visit: Payer: Self-pay | Admitting: Neurology

## 2015-06-20 ENCOUNTER — Telehealth: Payer: Self-pay

## 2015-06-20 NOTE — Telephone Encounter (Signed)
Patient has appt next month

## 2015-06-20 NOTE — Telephone Encounter (Signed)
Xanax Rx faxed to [facility].

## 2015-06-22 ENCOUNTER — Ambulatory Visit (INDEPENDENT_AMBULATORY_CARE_PROVIDER_SITE_OTHER): Payer: Medicare Other | Admitting: Cardiology

## 2015-06-22 ENCOUNTER — Encounter: Payer: Self-pay | Admitting: Cardiology

## 2015-06-22 VITALS — BP 104/58 | HR 76 | Ht 62.0 in | Wt 95.5 lb

## 2015-06-22 DIAGNOSIS — I341 Nonrheumatic mitral (valve) prolapse: Secondary | ICD-10-CM

## 2015-06-22 DIAGNOSIS — I5032 Chronic diastolic (congestive) heart failure: Secondary | ICD-10-CM

## 2015-06-22 DIAGNOSIS — J3089 Other allergic rhinitis: Secondary | ICD-10-CM | POA: Diagnosis not present

## 2015-06-22 DIAGNOSIS — I340 Nonrheumatic mitral (valve) insufficiency: Secondary | ICD-10-CM

## 2015-06-22 NOTE — Patient Instructions (Signed)
Continue your current therapy  I will see you in 3 months with lab work.

## 2015-06-23 ENCOUNTER — Other Ambulatory Visit: Payer: Self-pay | Admitting: Neurology

## 2015-06-23 NOTE — Progress Notes (Signed)
Cardiology Office Note   Date:  06/23/2015   ID:  Jill Hamilton, DOB 1934/07/21, MRN [id number]  PCP:  [name], MD  Cardiologist:   Peter Martinique, MD   Chief Complaint  Patient presents with  . Congestive Heart Failure      History of Present Illness: Jill Hamilton is a 81 y.o. female is seen for follow up of CHF.  She was seen with new onset CHF and a murmur. We increased lasix to 40 mg bid and she had an excellent diuretic response. On her last visit we were able to reduce her lasix to daily.Her breathing is doing well.  No edema. Weight has remained stable. She does note some intermittent cough at night.  Evaluation with Echo showed normal LV function with flail portion of the anterior leaflet and moderate to severe MR.   Past Medical History  Diagnosis Date  . Hyperlipidemia   . Parkinson's disease   . Scoliosis   . Asthma   . Arthritis   . Anxiety   . Osteoporosis   . H/O seasonal allergies   . Mitral valve prolapse   . Moderate mitral insufficiency     Past Surgical History  Procedure Laterality Date  . Abdominal hysterectomy    . Cholecystectomy    . Carpal tunnel release      bilateral  . Hammer toe surgery      bilateral  . Esophagogastroduodenoscopy  08/12/12  . Appendectomy      with hysterectomy  . Bladder surgery      bladder tacked  . Hiatal hernia repair  12/15/2012    laparascopic repair  . Hiatal hernia repair  12/15/2012    Procedure: LAPAROSCOPIC REPAIR OF HIATAL HERNIA;  Surgeon: [name], MD;  Location: [facility];  Service: General;;     Current Outpatient Prescriptions  Medication Sig Dispense Refill  . albuterol (PROVENTIL HFA;VENTOLIN HFA) 108 (90 BASE) MCG/ACT inhaler Inhale 2 puffs into the lungs every 6 (six) hours as needed. For shortness of breath    . ALPRAZolam (XANAX) 0.5 MG tablet TAKE 1/2 TABLET BY MOUTH TWICE DAILY AS NEEDED FOR ANXIETY 30 tablet 1  . amoxicillin (AMOXIL) 500 MG capsule Taken when going to  dentist    . brimonidine (ALPHAGAN) 0.15 % ophthalmic solution Place 1 drop into both eyes daily.  6  . carbidopa-levodopa-entacapone (STALEVO) 37.5-150-200 MG per tablet TAKE ONE TABLET FIVE TIMES A DAY. 7AM, 10AM, 1PM, AT 4 PM, AND 7PM 150 tablet 3  . citalopram (CELEXA) 40 MG tablet Take 1 tablet (40 mg total) by mouth every morning. 90 tablet 3  . ezetimibe (ZETIA) 10 MG tablet Take 10 mg by mouth at bedtime.     . fluticasone (FLONASE) 50 MCG/ACT nasal spray As needed  6  . furosemide (LASIX) 40 MG tablet Take 1 tablet (40 mg total) by mouth daily. 60 tablet 6  . HYDROcodone-acetaminophen (NORCO/VICODIN) 5-325 MG per tablet Take 1 tablet by mouth every 6 (six) hours as needed. For pain    . omeprazole (PRILOSEC) 40 MG capsule Take 1 capsule (40 mg total) by mouth every morning. 80 capsule 3  . potassium chloride SA (K-DUR,KLOR-CON) 20 MEQ tablet Take 1 tablet (20 mEq total) by mouth 2 (two) times daily. 60 tablet 6  . prednisoLONE acetate (PRED FORTE) 1 % ophthalmic suspension     . predniSONE (DELTASONE) 10 MG tablet Take 10 tablets by mouth taper from 4 doses each day to  1 dose and stop.  0  . PREVIDENT 5000 DRY MOUTH 1.1 % GEL dental gel Take 1 mL by mouth See admin instructions.  0  . PREVIDENT 5000 DRY MOUTH 1.1 % PSTE Place 1 application onto teeth 3 (three) times a week.     . traMADol (ULTRAM) 50 MG tablet Take 1 tablet by mouth as needed.    . TRAVATAN Z 0.004 % SOLN ophthalmic solution daily. Both eyes     No current facility-administered medications for this visit.    Allergies:   Iodine and Iohexol    Social History:  The patient  reports that she has never smoked. She has never used smokeless tobacco. She reports that she does not drink alcohol or use illicit drugs.   Family History:  The patient's family history includes Heart disease in her father.    ROS:  Please see the history of present illness.      All other systems are reviewed and negative.    PHYSICAL  EXAM: VS:  BP 104/58 mmHg  Pulse 76  Ht 5\' 2"  (1.575 m)  Wt 43.319 kg (95 lb 8 oz)  BMI 17.46 kg/m2 , BMI Body mass index is 17.46 kg/(m^2). GEN: elderly WF in no distress. She appears frail.  HEENT: normal Neck: no JVD. No carotid bruits, or masses Cardiac: RRR;  Normal S1-2. Loud 3/6 holosystolic murmur at the apex radiating to left axilla.  Respiratory:  Clear GI: soft, nontender, nondistended, + BS MS: no deformity or atrophy Skin: warm and dry, no rash. No edema. Neuro:  Strength and sensation are intact Psych: euthymic mood, full affect   EKG:  EKG is not ordered today.   Recent Labs: 04/17/2015: BUN 29*; Creat 1.00; Potassium 4.7; Sodium 139    Lipid Panel No results found for: CHOL, TRIG, HDL, CHOLHDL, VLDL, LDLCALC, LDLDIRECT    Wt Readings from Last 3 Encounters:  06/22/15 43.319 kg (95 lb 8 oz)  04/13/15 44.226 kg (97 lb 8 oz)  04/06/15 49.17 kg (108 lb 6.4 oz)      Other studies Reviewed: Echo 04/06/15:Study Conclusions  - Left ventricle: The cavity size was normal. Wall thickness was normal. Systolic function was normal. The estimated ejection fraction was in the range of 60% to 65%. Wall motion was normal; there were no regional wall motion abnormalities. Left ventricular diastolic function parameters were normal. - Aortic valve: Trileaflet; mildly calcified leaflets, especially the non-coronary cusp. There was no regurgitation. - Mitral valve: Mildly thickened leaflets. Mild stenosis due to leaflet prolapse. There is a flail chord of the anterior leaflet, seen prolapsing with the leaflet across the valve plane. Thare is moderate to severe posteriorly directed regurgitation. Valve area by continuity equation (using LVOT flow): 1.98 cm^2. - Left atrium: Severely dilated at 65 ml/m2. - Right ventricle: The cavity size was mildly dilated. - Right atrium: The atrium was mildly dilated. - Tricuspid valve: There was moderate  regurgitation. - Pulmonary arteries: PA peak pressure: 64 mm Hg (S). - Inferior vena cava: The vessel was normal in size. The respirophasic diameter changes were in the normal range (= 50%), consistent with normal central venous pressure.  Impressions:  - LVEF 60-65%, normal wall motion, normal wall thickness, thickened mitral leaflets with a flail segment of the anterior leaflet and possible mass of the leaflet, with flail chord that prolapses in and out of the valve plane. There is moderate to severe, posteriorly directed regurgitation. There is calcification of the non-coronary cuse of the  aortic valve, without stenosis. There is moderate TR with moderate pulmonary hypertension at 64 mmHg.    ASSESSMENT AND PLAN:  1.  Acute CHF - secondary  to valvular heart disease with mitral insufficiency. LV function is normal. Excellent response to diuresis. Appears euvolemic now.  Will continue lasix to 40 mg daily. Weight is down 2 lbs. Continue potassium.  2. 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 Ideally we would consider MV repair but she is a frail, thin elderly female with decreased functional status, parkinson's disease, and scoliosis. I think that she would be high risk for surgical repair. Given her good response to diuretic therapy I am hopeful that she can be managed medically. Will need close follow up and repeat assessment of Echo findings in 3 months. If her clinical status deteriorates then we will need to proceed with right and left heart cath and TEE to consider her suitability for repair.  3. CAD. Apparent negative cath 20 yrs ago but chest CT in 2010 showed significant coronary calcification. Inferolateral ischemia by Ecg. No chest pain. 4. Asthma. 5. Hyperlipidemia. On Zetia.    Current medicines are reviewed at length with the patient today.  The patient does not have concerns regarding medicines.  The  following changes have been made:  See above  Labs/ tests ordered today include:   Orders Placed This Encounter  Procedures  . CBC with Differential  . Basic metabolic panel  . B Nat Peptide     Disposition:   FU in 3 months with above lab work. Will anticipate repeat Echo at that time.   Signed, Peter Martinique, [place]  06/23/2015 8:45 AM    [facility] [address] [place], [place], [address] Phone [phone], Fax [phone]

## 2015-06-27 DIAGNOSIS — J3089 Other allergic rhinitis: Secondary | ICD-10-CM | POA: Diagnosis not present

## 2015-07-05 DIAGNOSIS — J3089 Other allergic rhinitis: Secondary | ICD-10-CM | POA: Diagnosis not present

## 2015-07-18 DIAGNOSIS — J3089 Other allergic rhinitis: Secondary | ICD-10-CM | POA: Diagnosis not present

## 2015-07-24 ENCOUNTER — Encounter: Payer: Self-pay | Admitting: Neurology

## 2015-07-24 ENCOUNTER — Ambulatory Visit (INDEPENDENT_AMBULATORY_CARE_PROVIDER_SITE_OTHER): Payer: Medicare Other | Admitting: Neurology

## 2015-07-24 VITALS — BP 98/56 | HR 72 | Resp 16 | Ht 62.0 in | Wt 95.0 lb

## 2015-07-24 DIAGNOSIS — I959 Hypotension, unspecified: Secondary | ICD-10-CM | POA: Diagnosis not present

## 2015-07-24 DIAGNOSIS — G20 Parkinson's disease: Secondary | ICD-10-CM

## 2015-07-24 DIAGNOSIS — J3089 Other allergic rhinitis: Secondary | ICD-10-CM | POA: Diagnosis not present

## 2015-07-24 DIAGNOSIS — R634 Abnormal weight loss: Secondary | ICD-10-CM | POA: Diagnosis not present

## 2015-07-24 DIAGNOSIS — G249 Dystonia, unspecified: Secondary | ICD-10-CM | POA: Diagnosis not present

## 2015-07-24 MED ORDER — CARBIDOPA-LEVODOPA-ENTACAPONE 37.5-150-200 MG PO TABS: ORAL_TABLET | ORAL | Status: DC

## 2015-07-24 NOTE — Progress Notes (Signed)
Subjective:    Patient ID: Jill Hamilton is a 81 y.o. female.  HPI     Interim history:   Jill Hamilton is a very pleasant 81 year-old right-handed woman with an underlying medical history of hyperlipidemia, type 2 diabetes, scoliosis, asthma, arthritis and anxiety, who presents for followup consultation of her right-sided predominant Parkinson's disease of approximately 11-years duration. She is unaccompanied today. I last saw her on 01/09/2015, at which time she reported feeling fairly well. She felt that the increase in Stalevo was helpful. She had ongoing issues with spells of low energy. These seem to be more in the mornings, especially after breakfast. She described a feeling of being drained and having to sit down to rest she was reporting this especially after standing after prolonged sitting. She reported no recent falls. I suggested we continue Stalevo at 1 pill 5 times a day and she was advised regarding orthostatic hypotension and symptomatic management at home for this.  Today, 07/24/2015: She reports lack of energy. She had been dealing with her CHF in the interim. She takes hydrocodone 1 1/2 pills or two per day. She is taking Lasix 40 mg once daily, which is down from twice daily. She has been using ensure once a day. She takes Stalevo 5 times a day but is worried about its cost. She also has other prescription brand name drugs that are costly for her. She has not noticed any new Parkinson's symptoms but feels that her balance is worse and that her energy level is worse.  Previously:   I saw her on 09/01/14, at which time she reported worseing of her walking and her balance and she felt, she had less stamina. Her appetite and weight were stable. She was taking her pain medication as needed. She was using her cane. She had fallen once. We mutually agreed to increase her Stalevo 150-1 pill 5 times a day.  I saw her on 03/29/2014, at which time she reported having fallen 4 or 5 times  during her move from their home to independent living in [place]. They moved on 03/05/2014 and she reported liking their new place, it certainly was a big transition leaving their own home of 46 years. She fell in April and hit her head and needed stitches and again fell in May and hit her head and needed stitches. She was taken to [facility] and had a CT head and this was negative per patient and her husband. She had been using her 4 prong cane. She also has a 4 wheeled walker with seat. She reported some lack of energy and fatigue but also had been pushing herself during that move to her new place. I did not make any changes to her medications but we talked about gait safety, and staying well hydrated and not pushing the limits of her physical stamina.  I saw her on 11/15/2013, at which time I suggested she use her cane as needed. I did not change her medications as I felt she was fairly stable. She reported a recent fall with bruised her hip and did not injure herself. She endorses some memory loss and no depression and mild constipation treated with MiraLax every other day.   I saw her on 05/04/2013, at which time I continued her on Stalevo 150 mg 4 times a day. However, in July she called back reporting an increase in price of generic Stalevo and therefore I switched her to a generic Sinemet and added Comtan, but the  combination was even more expensive, so she switched back to generic Stalevo.    I first met her on 02/25/2013 and she previously followed with Dr. Morene Antu and was last seen by him on 12/14/2012 at which time he kept her on the same medication regimen. She presented for a sooner appointment rather than her 6 month followup because of flareup in her Parkinson's symptoms after her hiatal hernia surgery on 12/15/2012. She began experiencing worsening balance and stiffness and problems with her gait and started using a cane since her surgery which she had not used before.   She was  diagnosed with Parkinson's disease in 2006 and initially started on carbidopa-levodopa with Requip added. She developed dyskinesias and had side effects with amantadine including livedo reticularis. She has swelling in both legs and left knee pain and needs perhaps a knee replacement. She had arthroscopic surgery some 3 years ago on the same knee. She has lower back pain and had a lumbar spine MRI in the past as well as x-rays and has undergone 3 epidural injections.   I increased her Stalevo to 4 times a day from 3 times a day in 4/14, at 7, 11, 3 and 7. I also recommended that she continue using her 4 pronged cane and recommended of 2 month followup. She noticed improvement with the increase in Stalevo and she had stopped using her cane.    Her Past Medical History Is Significant For: Past Medical History  Diagnosis Date  . Hyperlipidemia   . Parkinson's disease   . Scoliosis   . Asthma   . Arthritis   . Anxiety   . Osteoporosis   . H/O seasonal allergies   . Mitral valve prolapse   . Moderate mitral insufficiency     Her Past Surgical History Is Significant For: Past Surgical History  Procedure Laterality Date  . Abdominal hysterectomy    . Cholecystectomy    . Carpal tunnel release      bilateral  . Hammer toe surgery      bilateral  . Esophagogastroduodenoscopy  08/12/12  . Appendectomy      with hysterectomy  . Bladder surgery      bladder tacked  . Hiatal hernia repair  12/15/2012    laparascopic repair  . Hiatal hernia repair  12/15/2012    Procedure: LAPAROSCOPIC REPAIR OF HIATAL HERNIA;  Surgeon: [name], MD;  Location: [facility];  Service: General;;    Her Family History Is Significant For: Family History  Problem Relation Age of Onset  . Heart disease Father     Her Social History Is Significant For: Social History   Social History  . Marital Status: Married    Spouse Name: Jill Hamilton  . Number of Children: 1  . Years of Education: 12   Occupational  History  .      retired    Social History Main Topics  . Smoking status: Never Smoker   . Smokeless tobacco: Never Used  . Alcohol Use: No  . Drug Use: No  . Sexual Activity: Not Asked   Other Topics Concern  . None   Social History Narrative    Her Allergies Are:  Allergies  Allergen Reactions  . Iodine   . Iohexol      Code: HIVES, Desc: reaction after a heart cath per pt--needs 13 hour prep, Onset Date: [id number]   :   Her Current Medications Are:  Outpatient Encounter Prescriptions as of 07/24/2015  Medication Sig  .  albuterol (PROVENTIL HFA;VENTOLIN HFA) 108 (90 BASE) MCG/ACT inhaler Inhale 2 puffs into the lungs every 6 (six) hours as needed. For shortness of breath  . ALPRAZolam (XANAX) 0.5 MG tablet TAKE 1/2 TABLET BY MOUTH TWICE DAILY AS NEEDED FOR ANXIETY  . amoxicillin (AMOXIL) 500 MG capsule Taken when going to dentist  . brimonidine (ALPHAGAN) 0.15 % ophthalmic solution Place 1 drop into both eyes daily.  . carbidopa-levodopa-entacapone (STALEVO) 37.5-150-200 MG per tablet TAKE ONE TABLET FIVE TIMES A DAY. 7AM, 10AM, 1PM, AT 4 PM, AND 7PM  . citalopram (CELEXA) 40 MG tablet Take 1 tablet (40 mg total) by mouth every morning.  . ezetimibe (ZETIA) 10 MG tablet Take 10 mg by mouth at bedtime.   . fluticasone (FLONASE) 50 MCG/ACT nasal spray As needed  . furosemide (LASIX) 40 MG tablet Take 1 tablet (40 mg total) by mouth daily.  Marland Kitchen HYDROcodone-acetaminophen (NORCO/VICODIN) 5-325 MG per tablet Take 1 tablet by mouth every 6 (six) hours as needed. For pain  . omeprazole (PRILOSEC) 40 MG capsule Take 1 capsule (40 mg total) by mouth every morning.  . potassium chloride SA (K-DUR,KLOR-CON) 20 MEQ tablet Take 1 tablet (20 mEq total) by mouth 2 (two) times daily.  . prednisoLONE acetate (PRED FORTE) 1 % ophthalmic suspension   . predniSONE (DELTASONE) 10 MG tablet Take 10 tablets by mouth taper from 4 doses each day to 1 dose and stop.  Marland Kitchen PREVIDENT 5000 DRY MOUTH 1.1 % GEL  dental gel Take 1 mL by mouth See admin instructions.  . traMADol (ULTRAM) 50 MG tablet Take 1 tablet by mouth as needed.  . TRAVATAN Z 0.004 % SOLN ophthalmic solution daily. Both eyes  . [DISCONTINUED] PREVIDENT 5000 DRY MOUTH 1.1 % PSTE Place 1 application onto teeth 3 (three) times a week.    No facility-administered encounter medications on file as of 07/24/2015.  :  Review of Systems:  Out of a complete 14 point review of systems, all are reviewed and negative with the exception of these symptoms as listed below:  Review of Systems  Cardiovascular:       Patient reports having some "heart problems" since last visit.   Neurological:       Patient reports that she has no energy.     Objective:  Neurologic Exam  Physical Exam Physical Examination:   Filed Vitals:   07/24/15 1520  BP: 98/56  Pulse: 72  Resp: 16     General Examination: The patient is a very pleasant 81 y.o. female in no acute distress. She appears more frail and deconditioned today.  HEENT: Normocephalic, atraumatic, pupils are equal, round and reactive to light and accommodation. Funduscopic exam is normal with sharp disc margins noted. Extraocular tracking shows mild saccadic breakdown without nystagmus noted. There is limitation to upper gaze. There is mild decrease in eye blink rate. Hearing is intact.Face is symmetric with moderate facial masking and mild facial dyskinesias and normal facial sensation. There is no lip, neck or jaw tremor. Neck is moderately rigid with intact passive ROM. There are no carotid bruits on auscultation. Oropharynx exam reveals moderate to severe mouth dryness. No significant airway crowding is noted. Mallampati is class II. Tongue protrudes centrally and palate elevates symmetrically. There is no drooling. She has mild to moderate neck and facial dyskinesias.   Chest: is clear to auscultation without wheezing, rhonchi or crackles noted.  Heart: sounds are regular and normal  without murmurs, rubs or gallops noted.   Abdomen: is  soft, non-tender and non-distended with normal bowel sounds appreciated on auscultation.  Extremities: There is no pitting edema in the distal lower extremities bilaterally.   Skin: is warm and dry with no trophic changes noted.  Musculoskeletal: exam reveals ulnar deviation and joint deformities in keeping with OA in her hands.   Neurologically:  Mental status: The patient is awake and alert, paying good attention. She is able to completely provide the history. She is oriented to: person, place, time/date, situation, day of week, month of year and year. Her memory, attention, language and knowledge are intact. There is no aphasia, agnosia, apraxia or anomia. There is a mild degree of bradyphrenia. Speech is mild to moderately hypophonic with mild dysarthria noted. Mood is congruent and affect is normal.   Cranial nerves are as described above under HEENT exam. In addition, shoulder shrug is normal with equal shoulder height noted.  Motor exam: thin bulk, and normal strength for age is noted. There are mild to moderate generalized dyskinesias noted, which fairly constant. Tone is mildly rigid throughout, with mild cogwheeling noted in the right upper extremity only. There is overall moderate bradykinesia. There is no drift or rebound. There is no tremor. Romberg is negative. Reflexes are trace in the upper extremities and trace in the lower extremities. Fine motor skills exam reveals: Finger taps are moderately impaired on the right and mildly impaired on the left. Hand movements are moderately impaired on the right and mildly impaired on the left. RAP (rapid alternating patting) is moderately impaired on the right and mild to moderately impaired on the left. Foot taps are moderately impaired on the right and moderately impaired on the left. Foot agility (in the form of heel stomping) is moderately impaired on the right and moderately impaired on  the left.    Cerebellar testing shows no dysmetria or intention tremor on finger to nose testing. Heel to shin is unremarkable bilaterally. There is no truncal or gait ataxia.   Sensory exam is intact to light touch in the upper and lower extremities.   Gait, station and balance: She stands up from the seated position with mild difficulty but needs no assistance, and pushes herself up from the chair. No veering to one side is noted. She is not noted to lean to the side. Posture is moderately stooped with scoliosis noted, unchanged. Stance is wide-based. She walks with decrease in stride length an/d pace and decreased arm swing on the right. She uses a 4 pronged cane but is able to walk without the cane some. She turns in 3 steps and has some insecurity with turns. Tandem walk is not possible. Balance is mild to moderately impaired.   Assessment and Plan:   In summary, Jill Hamilton is a very pleasant 81 year old female with an underlying medical history of hyperlipidemia, type 2 diabetes, scoliosis, asthma, arthritis, chronic LBP on as needed hydrocodone (followed by Dr. Nelva Bush), and anxiety, who presents for followup consultation of her right-sided predominant Parkinson's disease of approximately 15-BWIOM' duration, complicated by constipation, recurrent falls, generalized dyskinesias, advancing age, weight loss, deconditioning, and hypotension. Thankfully, she has not fallen recently but does have worsening of her energy level. She has a low BP today and has been losing weight, this is the lowest weight she has had for me. She has been on Lasix 40 mg daily and saw Dr. Martinique last month. She currently takes Stalevo 150 mg strength, 1 pill 5 times a day at 7, 10, 1, 4 and  7 PM. She feels that this has been helpful for her, but very costly. She has not noticed any worsening of her dyskinesias. Her low energy could be secondary to autonomic dysregulation and low BP with tendency to dehydration.  We will  continue with Stalevo 1 pill 5 times a day. I will ask Dr. Martinique if she could reduce her Lasix to 20 mg daily at this time, as her mouth is rather dry looking and her BP has been low. She is advised to increase her Ensure to 2 per day d/t weight loss. I will see her back in 4 months, sooner if the need arises and have asked her to call us with any interim questions, concerns, problems, updates or refill requests. I answered all her questions today and she was in agreement. I spent 20 minutes in total face-to-face time with the patient, more than 50% of which was spent in counseling and coordination of care, reviewing test results, reviewing medication and discussing or reviewing the diagnosis of advanced PD, its prognosis and treatment options.

## 2015-07-24 NOTE — Patient Instructions (Signed)
We will continue with Stalevo 5 times a day.   We will ask Dr. Martinique if you can reduce your lasix to 20 mg.   Try to increase your ensure to 2 bottles per day.   Your mouth is dry and BP low, these can be signs of dehydration.   You may need to drink more water.

## 2015-07-26 ENCOUNTER — Telehealth: Payer: Self-pay | Admitting: Neurology

## 2015-07-26 NOTE — Telephone Encounter (Signed)
Left message to call back  

## 2015-07-26 NOTE — Telephone Encounter (Signed)
Pls call patient: I received a response from her cardiologist, Dr. Martinique regarding her Lasix. He does not recommend that she reduce it any further, and he will reassess her soon when she has a follow-up appointment.

## 2015-07-27 ENCOUNTER — Telehealth: Payer: Self-pay | Admitting: Neurology

## 2015-07-27 NOTE — Telephone Encounter (Signed)
[name] returned your call and would like a call back dg

## 2015-07-27 NOTE — Telephone Encounter (Signed)
I spoke to patient and gave her the message below. She voices understanding and will call Dr. Doug Sou office with any further questions.

## 2015-07-27 NOTE — Telephone Encounter (Signed)
See other message

## 2015-08-02 ENCOUNTER — Ambulatory Visit (HOSPITAL_COMMUNITY)
Admission: RE | Admit: 2015-08-02 | Discharge: 2015-08-02 | Disposition: A | Discharge status: Home / Self Care | Payer: Medicare Other | Source: Ambulatory Visit | Attending: Internal Medicine | Admitting: Internal Medicine

## 2015-08-02 DIAGNOSIS — Z23 Encounter for immunization: Secondary | ICD-10-CM | POA: Diagnosis not present

## 2015-08-02 DIAGNOSIS — J3089 Other allergic rhinitis: Secondary | ICD-10-CM | POA: Diagnosis not present

## 2015-08-07 DIAGNOSIS — Z681 Body mass index (BMI) 19 or less, adult: Secondary | ICD-10-CM | POA: Diagnosis not present

## 2015-08-07 DIAGNOSIS — J3089 Other allergic rhinitis: Secondary | ICD-10-CM | POA: Diagnosis not present

## 2015-08-07 DIAGNOSIS — R634 Abnormal weight loss: Secondary | ICD-10-CM | POA: Diagnosis not present

## 2015-08-07 DIAGNOSIS — R252 Cramp and spasm: Secondary | ICD-10-CM | POA: Diagnosis not present

## 2015-08-07 DIAGNOSIS — G20 Parkinson's disease: Secondary | ICD-10-CM | POA: Diagnosis not present

## 2015-08-15 ENCOUNTER — Ambulatory Visit (HOSPITAL_COMMUNITY)
Admission: RE | Admit: 2015-08-15 | Discharge: 2015-08-15 | Disposition: A | Discharge status: Home / Self Care | Payer: Medicare Other | Source: Ambulatory Visit | Attending: Internal Medicine | Admitting: Internal Medicine

## 2015-08-15 DIAGNOSIS — M810 Age-related osteoporosis without current pathological fracture: Secondary | ICD-10-CM | POA: Insufficient documentation

## 2015-08-15 DIAGNOSIS — K2270 Barrett's esophagus without dysplasia: Secondary | ICD-10-CM | POA: Diagnosis not present

## 2015-08-15 MED ORDER — DENOSUMAB 60 MG/ML ~~LOC~~ SOLN
60.0000 mg | Freq: Once | SUBCUTANEOUS | Status: AC
  Administered 2015-08-15: 60 mg via SUBCUTANEOUS
  Filled 2015-08-15: qty 1

## 2015-08-21 DIAGNOSIS — H401131 Primary open-angle glaucoma, bilateral, mild stage: Secondary | ICD-10-CM | POA: Diagnosis not present

## 2015-08-21 DIAGNOSIS — H524 Presbyopia: Secondary | ICD-10-CM | POA: Diagnosis not present

## 2015-08-23 DIAGNOSIS — J3089 Other allergic rhinitis: Secondary | ICD-10-CM | POA: Diagnosis not present

## 2015-08-29 DIAGNOSIS — J3089 Other allergic rhinitis: Secondary | ICD-10-CM | POA: Diagnosis not present

## 2015-09-05 DIAGNOSIS — J3089 Other allergic rhinitis: Secondary | ICD-10-CM | POA: Diagnosis not present

## 2015-09-05 DIAGNOSIS — J301 Allergic rhinitis due to pollen: Secondary | ICD-10-CM | POA: Diagnosis not present

## 2015-09-05 DIAGNOSIS — Z1231 Encounter for screening mammogram for malignant neoplasm of breast: Secondary | ICD-10-CM | POA: Diagnosis not present

## 2015-09-07 ENCOUNTER — Other Ambulatory Visit: Payer: Self-pay | Admitting: Neurology

## 2015-09-18 DIAGNOSIS — J3089 Other allergic rhinitis: Secondary | ICD-10-CM | POA: Diagnosis not present

## 2015-09-27 DIAGNOSIS — J3089 Other allergic rhinitis: Secondary | ICD-10-CM | POA: Diagnosis not present

## 2015-09-28 ENCOUNTER — Ambulatory Visit: Payer: Medicare Other | Admitting: Cardiology

## 2015-10-03 DIAGNOSIS — J3089 Other allergic rhinitis: Secondary | ICD-10-CM | POA: Diagnosis not present

## 2015-10-04 ENCOUNTER — Ambulatory Visit (INDEPENDENT_AMBULATORY_CARE_PROVIDER_SITE_OTHER): Payer: Medicare Other | Admitting: Cardiology

## 2015-10-04 ENCOUNTER — Encounter: Payer: Self-pay | Admitting: Cardiology

## 2015-10-04 VITALS — BP 120/74 | HR 80 | Ht 62.5 in | Wt 98.4 lb

## 2015-10-04 DIAGNOSIS — I5032 Chronic diastolic (congestive) heart failure: Secondary | ICD-10-CM

## 2015-10-04 DIAGNOSIS — I341 Nonrheumatic mitral (valve) prolapse: Secondary | ICD-10-CM

## 2015-10-04 DIAGNOSIS — I340 Nonrheumatic mitral (valve) insufficiency: Secondary | ICD-10-CM

## 2015-10-04 NOTE — Progress Notes (Signed)
Cardiology Office Note   Date:  10/04/2015   ID:  Jill Hamilton, DOB [birth year], MRN [id number]  PCP:  [name], MD  Cardiologist:   Peter Martinique, MD   Chief Complaint  Patient presents with  . Follow-up    no chest pain, no shortness of breath, occassional edema, no pain in legs, has cramping in legs/toes, has lightheadedness, has dizziness      History of Present Illness: Jill Hamilton is a 81 y.o. female is seen for follow up of CHF.  She was seen with new onset CHF and a murmur. We increased lasix to 40 mg bid and she had an excellent diuretic response. She is now on lasix 40 mg daily.  On follow up today her breathing is doing well.  No edema. Weight has remained stable. No cough.   Echo in May 2016 showed normal LV function with flail portion of the anterior leaflet and moderate to severe MR.   Past Medical History  Diagnosis Date  . Hyperlipidemia   . Parkinson's disease (Parkman)   . Scoliosis   . Asthma   . Arthritis   . Anxiety   . Osteoporosis   . H/O seasonal allergies   . Mitral valve prolapse   . Moderate mitral insufficiency     Past Surgical History  Procedure Laterality Date  . Abdominal hysterectomy    . Cholecystectomy    . Carpal tunnel release      bilateral  . Hammer toe surgery      bilateral  . Esophagogastroduodenoscopy  08/12/12  . Appendectomy      with hysterectomy  . Bladder surgery      bladder tacked  . Hiatal hernia repair  12/15/2012    laparascopic repair  . Hiatal hernia repair  12/15/2012    Procedure: LAPAROSCOPIC REPAIR OF HIATAL HERNIA;  Surgeon: [name], MD;  Location: [facility];  Service: General;;     Current Outpatient Prescriptions  Medication Sig Dispense Refill  . albuterol (PROVENTIL HFA;VENTOLIN HFA) 108 (90 BASE) MCG/ACT inhaler Inhale 2 puffs into the lungs every 6 (six) hours as needed. For shortness of breath    . ALPRAZolam (XANAX) 0.5 MG tablet TAKE 1/2 TABLET BY MOUTH TWICE DAILY AS NEEDED  FOR ANXIETY 30 tablet 3  . amoxicillin (AMOXIL) 500 MG capsule Taken when going to dentist    . brimonidine (ALPHAGAN) 0.15 % ophthalmic solution Place 1 drop into both eyes daily.  6  . carbidopa-levodopa-entacapone (STALEVO) 37.5-150-200 MG per tablet TAKE ONE TABLET FIVE TIMES A DAY. 7AM, 10AM, 1PM, AT 4 PM, AND 7PM 150 tablet 5  . citalopram (CELEXA) 40 MG tablet Take 1 tablet (40 mg total) by mouth every morning. 90 tablet 3  . ezetimibe (ZETIA) 10 MG tablet Take 10 mg by mouth at bedtime.     . fluticasone (FLONASE) 50 MCG/ACT nasal spray As needed  6  . furosemide (LASIX) 40 MG tablet Take 1 tablet (40 mg total) by mouth daily. 60 tablet 6  . HYDROcodone-acetaminophen (NORCO/VICODIN) 5-325 MG per tablet Take 1 tablet by mouth every 6 (six) hours as needed. For pain    . omeprazole (PRILOSEC) 40 MG capsule Take 1 capsule (40 mg total) by mouth every morning. 80 capsule 3  . potassium chloride SA (K-DUR,KLOR-CON) 20 MEQ tablet Take 1 tablet (20 mEq total) by mouth 2 (two) times daily. 60 tablet 6  . prednisoLONE acetate (PRED FORTE) 1 % ophthalmic suspension     .  predniSONE (DELTASONE) 10 MG tablet Take 10 tablets by mouth taper from 4 doses each day to 1 dose and stop.  0  . PREVIDENT 5000 DRY MOUTH 1.1 % GEL dental gel Take 1 mL by mouth See admin instructions.  0  . rOPINIRole (REQUIP) 0.25 MG tablet Take 0.25 mg by mouth at bedtime. Prn take 1 tab daily at bedtime  3  . traMADol (ULTRAM) 50 MG tablet Take 1 tablet by mouth as needed.    . TRAVATAN Z 0.004 % SOLN ophthalmic solution daily. Both eyes     No current facility-administered medications for this visit.    Allergies:   Iodine and Iohexol    Social History:  The patient  reports that she has never smoked. She has never used smokeless tobacco. She reports that she does not drink alcohol or use illicit drugs.   Family History:  The patient's family history includes Heart disease in her father.    ROS:  Please see the  history of present illness.      All other systems are reviewed and negative.    PHYSICAL EXAM: VS:  BP 120/74 mmHg  Pulse 80  Ht 5' 2.5" (1.588 m)  Wt 44.623 kg (98 lb 6 oz)  BMI 17.70 kg/m2 , BMI Body mass index is 17.7 kg/(m^2). GEN: elderly WF in no distress. She appears frail.  HEENT: normal Neck: no JVD. No carotid bruits, or masses Cardiac: RRR;  Normal S1-2. Loud 3/6 holosystolic murmur at the apex radiating to left axilla.  Respiratory:  Clear GI: soft, nontender, nondistended, + BS MS: no deformity or atrophy Skin: warm and dry, no rash. No edema. Neuro:  Strength and sensation are intact Psych: euthymic mood, full affect   EKG:  EKG is not ordered today.   Recent Labs: 04/17/2015: BUN 29*; Creat 1.00; Potassium 4.7; Sodium 139    Lipid Panel No results found for: CHOL, TRIG, HDL, CHOLHDL, VLDL, LDLCALC, LDLDIRECT    Wt Readings from Last 3 Encounters:  10/04/15 44.623 kg (98 lb 6 oz)  07/24/15 43.092 kg (95 lb)  06/22/15 43.319 kg (95 lb 8 oz)      Other studies Reviewed: Echo 04/06/15:Study Conclusions  - Left ventricle: The cavity size was normal. Wall thickness was normal. Systolic function was normal. The estimated ejection fraction was in the range of 60% to 65%. Wall motion was normal; there were no regional wall motion abnormalities. Left ventricular diastolic function parameters were normal. - Aortic valve: Trileaflet; mildly calcified leaflets, especially the non-coronary cusp. There was no regurgitation. - Mitral valve: Mildly thickened leaflets. Mild stenosis due to leaflet prolapse. There is a flail chord of the anterior leaflet, seen prolapsing with the leaflet across the valve plane. Thare is moderate to severe posteriorly directed regurgitation. Valve area by continuity equation (using LVOT flow): 1.98 cm^2. - Left atrium: Severely dilated at 65 ml/m2. - Right ventricle: The cavity size was mildly dilated. - Right  atrium: The atrium was mildly dilated. - Tricuspid valve: There was moderate regurgitation. - Pulmonary arteries: PA peak pressure: 64 mm Hg (S). - Inferior vena cava: The vessel was normal in size. The respirophasic diameter changes were in the normal range (= 50%), consistent with normal central venous pressure.  Impressions:  - LVEF 60-65%, normal wall motion, normal wall thickness, thickened mitral leaflets with a flail segment of the anterior leaflet and possible mass of the leaflet, with flail chord that prolapses in and out of the valve plane. There  is moderate to severe, posteriorly directed regurgitation. There is calcification of the non-coronary cuse of the aortic valve, without stenosis. There is moderate TR with moderate pulmonary hypertension at 64 mmHg.    ASSESSMENT AND PLAN:  1.  Chronic diastolic CHF - secondary  to valvular heart disease with mitral insufficiency. LV function is normal. Excellent response to diuresis. Appears euvolemic now.  Will continue lasix to 40 mg daily.   2. 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 Ideally we would consider MV repair but she is a frail, thin elderly female with decreased functional status, parkinson's disease, and scoliosis. I think that she would be high risk for surgical repair. She is doing well on medical therapy. Continue same.   3. CAD. Apparent negative cath 20 yrs ago but chest CT in 2010 showed significant coronary calcification. Inferolateral ischemia by Ecg. No chest pain.  4. Asthma.  5. Hyperlipidemia. On Zetia.   6. Parkinson's disease.    Current medicines are reviewed at length with the patient today.  The patient does not have concerns regarding medicines.  The following changes have been made:  See above  Labs/ tests ordered today include:   No orders of the defined types were placed in this encounter.     Disposition:   FU in 6  months   Signed, Peter Martinique, MD,FACC  10/04/2015 2:48 PM    [facility] [address] [place], [place], [address] Phone [phone], Fax [phone]

## 2015-10-04 NOTE — Patient Instructions (Signed)
Continue your current therapy  I will see you in 6 months.   

## 2015-10-09 DIAGNOSIS — J3089 Other allergic rhinitis: Secondary | ICD-10-CM | POA: Diagnosis not present

## 2015-10-12 DIAGNOSIS — J3089 Other allergic rhinitis: Secondary | ICD-10-CM | POA: Diagnosis not present

## 2015-10-16 DIAGNOSIS — J301 Allergic rhinitis due to pollen: Secondary | ICD-10-CM | POA: Diagnosis not present

## 2015-10-24 ENCOUNTER — Other Ambulatory Visit: Payer: Self-pay | Admitting: Neurology

## 2015-10-25 DIAGNOSIS — J3089 Other allergic rhinitis: Secondary | ICD-10-CM | POA: Diagnosis not present

## 2015-10-31 ENCOUNTER — Other Ambulatory Visit: Payer: Self-pay | Admitting: Neurology

## 2015-10-31 NOTE — Telephone Encounter (Signed)
4 month Rx was sent on 10/27.  I called the pharmacy and spoke with Quita Skye.  They will fill Rx as prescribed.

## 2015-11-08 DIAGNOSIS — J3089 Other allergic rhinitis: Secondary | ICD-10-CM | POA: Diagnosis not present

## 2015-11-15 ENCOUNTER — Telehealth: Payer: Self-pay

## 2015-11-15 DIAGNOSIS — J3089 Other allergic rhinitis: Secondary | ICD-10-CM | POA: Diagnosis not present

## 2015-11-15 NOTE — Telephone Encounter (Signed)
Patient did not pick up xanax prescription dated 09/07/15. I have destroyed this prescription.

## 2015-11-19 ENCOUNTER — Telehealth: Payer: Self-pay

## 2015-11-19 NOTE — Telephone Encounter (Signed)
I spoke to patient to let her know that the office will be closed 11/20/15 due to weather. She did not want to reschedule at this time but will call back Tuesday to reschedule.

## 2015-11-20 ENCOUNTER — Ambulatory Visit: Payer: Medicare Other | Admitting: Neurology

## 2015-11-23 DIAGNOSIS — J3089 Other allergic rhinitis: Secondary | ICD-10-CM | POA: Diagnosis not present

## 2015-11-27 ENCOUNTER — Other Ambulatory Visit: Payer: Self-pay | Admitting: Neurology

## 2015-11-28 DIAGNOSIS — H401131 Primary open-angle glaucoma, bilateral, mild stage: Secondary | ICD-10-CM | POA: Diagnosis not present

## 2015-11-29 DIAGNOSIS — J209 Acute bronchitis, unspecified: Secondary | ICD-10-CM | POA: Diagnosis not present

## 2015-11-29 DIAGNOSIS — J4520 Mild intermittent asthma, uncomplicated: Secondary | ICD-10-CM | POA: Diagnosis not present

## 2015-11-29 DIAGNOSIS — H1045 Other chronic allergic conjunctivitis: Secondary | ICD-10-CM | POA: Diagnosis not present

## 2015-11-29 DIAGNOSIS — J3089 Other allergic rhinitis: Secondary | ICD-10-CM | POA: Diagnosis not present

## 2015-12-11 DIAGNOSIS — J3089 Other allergic rhinitis: Secondary | ICD-10-CM | POA: Diagnosis not present

## 2015-12-11 DIAGNOSIS — E7800 Pure hypercholesterolemia, unspecified: Secondary | ICD-10-CM | POA: Diagnosis not present

## 2015-12-11 DIAGNOSIS — M810 Age-related osteoporosis without current pathological fracture: Secondary | ICD-10-CM | POA: Diagnosis not present

## 2015-12-11 DIAGNOSIS — R8299 Other abnormal findings in urine: Secondary | ICD-10-CM | POA: Diagnosis not present

## 2015-12-11 DIAGNOSIS — N390 Urinary tract infection, site not specified: Secondary | ICD-10-CM | POA: Diagnosis not present

## 2015-12-18 DIAGNOSIS — R300 Dysuria: Secondary | ICD-10-CM | POA: Diagnosis not present

## 2015-12-18 DIAGNOSIS — J3089 Other allergic rhinitis: Secondary | ICD-10-CM | POA: Diagnosis not present

## 2015-12-18 DIAGNOSIS — K2270 Barrett's esophagus without dysplasia: Secondary | ICD-10-CM | POA: Diagnosis not present

## 2015-12-18 DIAGNOSIS — E7800 Pure hypercholesterolemia, unspecified: Secondary | ICD-10-CM | POA: Diagnosis not present

## 2015-12-18 DIAGNOSIS — Z0000 Encounter for general adult medical examination without abnormal findings: Secondary | ICD-10-CM | POA: Diagnosis not present

## 2015-12-18 DIAGNOSIS — H4089 Other specified glaucoma: Secondary | ICD-10-CM | POA: Diagnosis not present

## 2015-12-18 DIAGNOSIS — R1906 Epigastric swelling, mass or lump: Secondary | ICD-10-CM | POA: Diagnosis not present

## 2015-12-18 DIAGNOSIS — J45909 Unspecified asthma, uncomplicated: Secondary | ICD-10-CM | POA: Diagnosis not present

## 2015-12-18 DIAGNOSIS — Z1389 Encounter for screening for other disorder: Secondary | ICD-10-CM | POA: Diagnosis not present

## 2015-12-18 DIAGNOSIS — I340 Nonrheumatic mitral (valve) insufficiency: Secondary | ICD-10-CM | POA: Diagnosis not present

## 2015-12-20 ENCOUNTER — Telehealth: Payer: Self-pay | Admitting: Neurology

## 2015-12-20 NOTE — Telephone Encounter (Signed)
Colletta Maryland with [facility] is calling regarding the patient. She says the patient states her ALPRAZolam (XANAX) 0.5 MG tablet has changed to 1mg . Please call and advise.

## 2015-12-20 NOTE — Telephone Encounter (Signed)
I called and spoke with the patient to clarify.  She said [facility] must have called [place] in error, because her other MD, Dr Dagmar Hait, changed this Rx on Monday when she was there for an OV.  I called [facility] back.  Spoke with [employer].  She will contact Dr Dagmar Hait regarding Rx.

## 2015-12-25 DIAGNOSIS — J3089 Other allergic rhinitis: Secondary | ICD-10-CM | POA: Diagnosis not present

## 2016-01-02 ENCOUNTER — Ambulatory Visit (INDEPENDENT_AMBULATORY_CARE_PROVIDER_SITE_OTHER): Payer: Medicare Other | Admitting: Neurology

## 2016-01-02 ENCOUNTER — Encounter: Payer: Self-pay | Admitting: Neurology

## 2016-01-02 VITALS — BP 108/62 | HR 70 | Resp 14 | Ht 62.0 in | Wt 99.5 lb

## 2016-01-02 DIAGNOSIS — G249 Dystonia, unspecified: Secondary | ICD-10-CM | POA: Diagnosis not present

## 2016-01-02 DIAGNOSIS — F419 Anxiety disorder, unspecified: Secondary | ICD-10-CM

## 2016-01-02 DIAGNOSIS — G20 Parkinson's disease: Secondary | ICD-10-CM

## 2016-01-02 DIAGNOSIS — R296 Repeated falls: Secondary | ICD-10-CM

## 2016-01-02 MED ORDER — ALPRAZOLAM 0.5 MG PO TABS: 0.5000 mg | ORAL_TABLET | ORAL | Status: DC | PRN

## 2016-01-02 MED ORDER — CARBIDOPA-LEVODOPA-ENTACAPONE 37.5-150-200 MG PO TABS: ORAL_TABLET | ORAL | Status: DC

## 2016-01-02 NOTE — Progress Notes (Signed)
Subjective:    Patient ID: Jill Hamilton is a 82 y.o. female.  HPI     Interim history:   Jill Hamilton is a very pleasant 82 year-old right-handed woman with an underlying medical history of hyperlipidemia, type 2 diabetes, scoliosis, asthma, arthritis and anxiety, who presents for followup consultation of her right-sided predominant Parkinson's disease of approximately 76 to 12-years duration. She is accompanied by her husband today. I last saw her on 07/24/2015, at which time she reported lack of energy. She had been dealing with her CHF as well. She was taking hydrocodone 1-1/2 pills to 2 pills per day. She was also on Lasix every day. She was able to reduce the Lasix overall. She was on Stalevo 5 times a day and was wondering about its cost as well. She had not noticed anything new in her Parkinson symptoms except for her balance and her energy were worse. I did not ask her to change any of her medications but asked her to stay better hydrated and add protein milkshakes to her nutritional she was underweight.  Today, 01/02/2016: She reports doing reasonably well. She still has issues with fatigue and feeling globally weaker. She tries to hydrate well. She tries to stay active. Unfortunately, she fell in the kitchen about 3 weeks ago and landed on her buttocks. She was trying to turn suddenly. She does have a walker available but does not even use her cane typically inside the house. She tries to stay active. She does report some memory issues but nothing sinister.   Previously:   I saw her on 01/09/2015, at which time she reported feeling fairly well. She felt that the increase in Stalevo was helpful. She had ongoing issues with spells of low energy. These seem to be more in the mornings, especially after breakfast. She described a feeling of being drained and having to sit down to rest she was reporting this especially after standing after prolonged sitting. She reported no recent falls. I  suggested we continue Stalevo at 1 pill 5 times a day and she was advised regarding orthostatic hypotension and symptomatic management at home for this.  I saw her on 09/01/14, at which time she reported worseing of her walking and her balance and she felt, she had less stamina. Her appetite and weight were stable. She was taking her pain medication as needed. She was using her cane. She had fallen once. We mutually agreed to increase her Stalevo 150-1 pill 5 times a day.  I saw her on 03/29/2014, at which time she reported having fallen 4 or 5 times during her move from their home to independent living in [place]. They moved on 03/05/2014 and she reported liking their new place, it certainly was a big transition leaving their own home of 46 years. She fell in April and hit her head and needed stitches and again fell in May and hit her head and needed stitches. She was taken to [facility] and had a CT head and this was negative per patient and her husband. She had been using her 4 prong cane. She also has a 4 wheeled walker with seat. She reported some lack of energy and fatigue but also had been pushing herself during that move to her new place. I did not make any changes to her medications but we talked about gait safety, and staying well hydrated and not pushing the limits of her physical stamina.  I saw her on 11/15/2013, at which time I  suggested she use her cane as needed. I did not change her medications as I felt she was fairly stable. She reported a recent fall with bruised her hip and did not injure herself. She endorses some memory loss and no depression and mild constipation treated with MiraLax every other day.   I saw her on 05/04/2013, at which time I continued her on Stalevo 150 mg 4 times a day. However, in July she called back reporting an increase in price of generic Stalevo and therefore I switched her to a generic Sinemet and added Comtan, but the combination was even more  expensive, so she switched back to generic Stalevo.    I first met her on 02/25/2013 and she previously followed with Dr. Avie Echevaria and was last seen by him on 12/14/2012 at which time he kept her on the same medication regimen. She presented for a sooner appointment rather than her 6 month followup because of flareup in her Parkinson's symptoms after her hiatal hernia surgery on 12/15/2012. She began experiencing worsening balance and stiffness and problems with her gait and started using a cane since her surgery which she had not used before.   She was diagnosed with Parkinson's disease in 2006 and initially started on carbidopa-levodopa with Requip added. She developed dyskinesias and had side effects with amantadine including livedo reticularis. She has swelling in both legs and left knee pain and needs perhaps a knee replacement. She had arthroscopic surgery some 3 years ago on the same knee. She has lower back pain and had a lumbar spine MRI in the past as well as x-rays and has undergone 3 epidural injections.   I increased her Stalevo to 4 times a day from 3 times a day in 4/14, at 7, 11, 3 and 7. I also recommended that she continue using her 4 pronged cane and recommended of 2 month followup. She noticed improvement with the increase in Stalevo and she had stopped using her cane.    Her Past Medical History Is Significant For: Past Medical History  Diagnosis Date  . Hyperlipidemia   . Parkinson's disease (HCC)   . Scoliosis   . Asthma   . Arthritis   . Anxiety   . Osteoporosis   . H/O seasonal allergies   . Mitral valve prolapse   . Moderate mitral insufficiency     Her Past Surgical History Is Significant For: Past Surgical History  Procedure Laterality Date  . Abdominal hysterectomy    . Cholecystectomy    . Carpal tunnel release      bilateral  . Hammer toe surgery      bilateral  . Esophagogastroduodenoscopy  08/12/12  . Appendectomy      with hysterectomy  . Bladder  surgery      bladder tacked  . Hiatal hernia repair  12/15/2012    laparascopic repair  . Hiatal hernia repair  12/15/2012    Procedure: LAPAROSCOPIC REPAIR OF HIATAL HERNIA;  Surgeon: Valarie Merino, MD;  Location: [facility];  Service: General;;    Her Family History Is Significant For: Family History  Problem Relation Age of Onset  . Heart disease Father     Her Social History Is Significant For: Social History   Social History  . Marital Status: Married    Spouse Name: Hardie Pulley  . Number of Children: 1  . Years of Education: 12   Occupational History  .      retired    Social History Main  Topics  . Smoking status: Never Smoker   . Smokeless tobacco: Never Used  . Alcohol Use: No  . Drug Use: No  . Sexual Activity: Not Asked   Other Topics Concern  . None   Social History Narrative    Her Allergies Are:  Allergies  Allergen Reactions  . Iodine   . Iohexol      Code: HIVES, Desc: reaction after a heart cath per pt--needs 13 hour prep, Onset Date: [id number]   :   Her Current Medications Are:  Outpatient Encounter Prescriptions as of 01/02/2016  Medication Sig  . albuterol (PROVENTIL HFA;VENTOLIN HFA) 108 (90 BASE) MCG/ACT inhaler Inhale 2 puffs into the lungs every 6 (six) hours as needed. For shortness of breath  . ALPRAZolam (XANAX) 0.5 MG tablet TAKE 1/2 TABLET BY MOUTH TWICE DAILY AS NEEDED FOR ANXIETY  . amoxicillin (AMOXIL) 500 MG capsule Taken when going to dentist  . brimonidine (ALPHAGAN) 0.15 % ophthalmic solution Place 1 drop into both eyes daily.  . carbidopa-levodopa-entacapone (STALEVO) 37.5-150-200 MG per tablet TAKE ONE TABLET FIVE TIMES A DAY. 7AM, 10AM, 1PM, AT 4 PM, AND 7PM  . citalopram (CELEXA) 40 MG tablet TAKE ONE TABLET BY MOUTH EVERY MORNING  . ezetimibe (ZETIA) 10 MG tablet Take 10 mg by mouth at bedtime.   . fluticasone (FLONASE) 50 MCG/ACT nasal spray As needed  . furosemide (LASIX) 40 MG tablet Take 1 tablet (40 mg total) by mouth daily.   Marland Kitchen HYDROcodone-acetaminophen (NORCO/VICODIN) 5-325 MG per tablet Take 1 tablet by mouth every 6 (six) hours as needed. For pain  . omeprazole (PRILOSEC) 40 MG capsule Take 1 capsule (40 mg total) by mouth every morning.  . potassium chloride SA (K-DUR,KLOR-CON) 20 MEQ tablet Take 1 tablet (20 mEq total) by mouth 2 (two) times daily.  . prednisoLONE acetate (PRED FORTE) 1 % ophthalmic suspension   . predniSONE (DELTASONE) 10 MG tablet Take 10 tablets by mouth taper from 4 doses each day to 1 dose and stop.  Marland Kitchen PREVIDENT 5000 DRY MOUTH 1.1 % GEL dental gel Take 1 mL by mouth See admin instructions.  Marland Kitchen rOPINIRole (REQUIP) 0.25 MG tablet Take 0.25 mg by mouth at bedtime. Prn take 1 tab daily at bedtime  . traMADol (ULTRAM) 50 MG tablet Take 1 tablet by mouth as needed.  . TRAVATAN Z 0.004 % SOLN ophthalmic solution daily. Both eyes   No facility-administered encounter medications on file as of 01/02/2016.  :  Review of Systems:  Out of a complete 14 point review of systems, all are reviewed and negative with the exception of these symptoms as listed below:   Review of Systems  Neurological:       Patient feels like she has increased fatigue and weakness.     Objective:  Neurologic Exam  Physical Exam Physical Examination:   Filed Vitals:   01/02/16 1251  BP: 108/62  Pulse: 70  Resp: 14   General Examination: The patient is a very pleasant 82 y.o. female in no acute distress. She appears less deconditioned today. She is in good spirits.   HEENT: Normocephalic, atraumatic, pupils are equal, round and reactive to light and accommodation. Extraocular tracking shows mild saccadic breakdown without nystagmus noted. She is s/p b/l cataract repairs. There is limitation to upper gaze. There is mild decrease in eye blink rate. Hearing is intact.Face is symmetric with moderate facial masking and mild facial dyskinesias and normal facial sensation. There is no lip, neck or jaw tremor.  Neck is  moderately rigid with intact passive ROM. There are no carotid bruits on auscultation. Oropharynx exam reveals moderate to severe mouth dryness. No significant airway crowding is noted. Mallampati is class II. Tongue protrudes centrally and palate elevates symmetrically. There is no drooling. She has mild to moderate neck and facial dyskinesias.   Chest: is clear to auscultation without wheezing, rhonchi or crackles noted.  Heart: sounds are regular and normal with a slight systolic murmur and occasional brief pause noted.   Abdomen: is soft, non-tender and non-distended with normal bowel sounds appreciated on auscultation.  Extremities: There is no pitting edema in the distal lower extremities bilaterally.   Skin: is warm and dry with no trophic changes noted.  Musculoskeletal: exam reveals ulnar deviation and joint deformities in keeping with OA in her hands.   Neurologically:  Mental status: The patient is awake and alert, paying good attention. She is able to completely provide the history. She is oriented to: person, place, time/date, situation, day of week, month of year and year. Her memory, attention, language and knowledge are intact. There is no aphasia, agnosia, apraxia or anomia. There is a mild degree of bradyphrenia. Speech is moderately hypophonic with mild dysarthria noted. Mood is congruent and affect is normal.   Cranial nerves are as described above under HEENT exam. In addition, shoulder shrug is normal with equal shoulder height noted.  Motor exam: thin bulk, and normal strength for age is noted. There are mild generalized dyskinesias noted, which fairly constant. Tone is mildly rigid throughout, with mild cogwheeling noted in the right upper extremity only. There is overall moderate bradykinesia. There is no drift or rebound. There is no tremor. Romberg is negative. Reflexes are trace in the upper extremities and trace in the lower extremities. Fine motor skills exam reveals:  Finger taps are moderately impaired on the right and mildly impaired on the left. Hand movements are moderately impaired on the right and mildly impaired on the left. RAP (rapid alternating patting) is moderately impaired on the right and mild to moderately impaired on the left. Foot taps are moderately impaired on the right and moderately impaired on the left. Foot agility (in the form of heel stomping) is moderately impaired on the right and moderately impaired on the left.    Cerebellar testing shows no dysmetria or intention tremor on finger to nose testing. Heel to shin is unremarkable bilaterally. There is no truncal or gait ataxia.   Sensory exam is intact to light touch in the upper and lower extremities.   Gait, station and balance: She stands up from the seated position with mild difficulty but needs no assistance, and pushes herself up from the chair. No veering to one side is noted. She is not noted to lean to the side. Posture is moderately stooped with scoliosis noted, unchanged. Stance is wide-based. She walks with decrease in stride length an/d pace and decreased arm swing on the right. She uses a 4 pronged cane but is able to walk without the cane some. She turns in 3 steps and has some insecurity with turns. Tandem walk is not possible. Balance is mild to moderately impaired.   Assessment and Plan:   In summary, HELEM REESOR is a very pleasant 82 year old female with an underlying medical history of hyperlipidemia, type 2 diabetes, scoliosis, asthma, arthritis, chronic LBP on as needed hydrocodone (followed by Dr. Nelva Bush), and anxiety, who presents for followup consultation of her right-sided predominant Parkinson's disease of approximately  20-QVLDK' duration, complicated by constipation, recurrent falls, generalized dyskinesias, advancing age, weight loss, deconditioning, and hypotension. She fell 3 weeks ago in the kitchen, and thankfully, she did not hurt herself, landed on her  buttocks. Her BP and weight have been stable as well. She is maintained on Lasix 40 mg daily and saw Dr. Martinique in 11/16. He was pleased with how she was doing.  From my end of things I would like for her to stay on Stalevo 150 mg strength, 1 pill 5 times a day at 7, 10, 1, 4 and 7 PM. She feels that this has been helpful for her, but very costly. She has not noticed any worsening of her dyskinesias. Her low energy could be secondary to autonomic dysregulation and low BP with tendency to dehydration. She has been utilizing Ensure 2 x per day, which has helped. I would like to see her back in 4 months, sooner if needed. I renewed her prescription for Stalevo and also Xanax 0.5 mg strength one pill once daily as needed. I their questions today and the patient and her husband were in agreement.  I spent 25 minutes in total face-to-face time with the patient, more than 50% of which was spent in counseling and coordination of care, reviewing test results, reviewing medication and discussing or reviewing the diagnosis of advanced PD, its prognosis and treatment options.

## 2016-01-02 NOTE — Patient Instructions (Signed)
We will continue with your medications.  We will monitor your symptoms.  I renewed your prescription for Stalevo 5 times a day and Xanax as needed.

## 2016-01-09 DIAGNOSIS — J3089 Other allergic rhinitis: Secondary | ICD-10-CM | POA: Diagnosis not present

## 2016-01-16 DIAGNOSIS — M5136 Other intervertebral disc degeneration, lumbar region: Secondary | ICD-10-CM | POA: Diagnosis not present

## 2016-01-16 DIAGNOSIS — Z79891 Long term (current) use of opiate analgesic: Secondary | ICD-10-CM | POA: Diagnosis not present

## 2016-01-16 DIAGNOSIS — G894 Chronic pain syndrome: Secondary | ICD-10-CM | POA: Diagnosis not present

## 2016-02-06 DIAGNOSIS — J3089 Other allergic rhinitis: Secondary | ICD-10-CM | POA: Diagnosis not present

## 2016-02-08 ENCOUNTER — Other Ambulatory Visit: Payer: Self-pay | Admitting: Neurology

## 2016-02-27 DIAGNOSIS — H401131 Primary open-angle glaucoma, bilateral, mild stage: Secondary | ICD-10-CM | POA: Diagnosis not present

## 2016-02-28 DIAGNOSIS — J3089 Other allergic rhinitis: Secondary | ICD-10-CM | POA: Diagnosis not present

## 2016-03-18 DIAGNOSIS — J3089 Other allergic rhinitis: Secondary | ICD-10-CM | POA: Diagnosis not present

## 2016-03-22 ENCOUNTER — Telehealth: Payer: Self-pay | Admitting: *Deleted

## 2016-03-22 DIAGNOSIS — M79642 Pain in left hand: Secondary | ICD-10-CM | POA: Diagnosis not present

## 2016-03-22 DIAGNOSIS — M79602 Pain in left arm: Secondary | ICD-10-CM | POA: Diagnosis not present

## 2016-03-22 DIAGNOSIS — S3991XA Unspecified injury of abdomen, initial encounter: Secondary | ICD-10-CM | POA: Diagnosis not present

## 2016-03-22 DIAGNOSIS — I340 Nonrheumatic mitral (valve) insufficiency: Secondary | ICD-10-CM | POA: Diagnosis not present

## 2016-03-22 DIAGNOSIS — S1091XA Abrasion of unspecified part of neck, initial encounter: Secondary | ICD-10-CM | POA: Diagnosis not present

## 2016-03-22 DIAGNOSIS — S20211A Contusion of right front wall of thorax, initial encounter: Secondary | ICD-10-CM | POA: Diagnosis not present

## 2016-03-22 DIAGNOSIS — R0789 Other chest pain: Secondary | ICD-10-CM | POA: Diagnosis not present

## 2016-03-22 DIAGNOSIS — S199XXA Unspecified injury of neck, initial encounter: Secondary | ICD-10-CM | POA: Diagnosis not present

## 2016-03-22 DIAGNOSIS — R9089 Other abnormal findings on diagnostic imaging of central nervous system: Secondary | ICD-10-CM | POA: Diagnosis not present

## 2016-03-22 DIAGNOSIS — S4992XA Unspecified injury of left shoulder and upper arm, initial encounter: Secondary | ICD-10-CM | POA: Diagnosis not present

## 2016-03-22 DIAGNOSIS — S80211A Abrasion, right knee, initial encounter: Secondary | ICD-10-CM | POA: Diagnosis not present

## 2016-03-22 DIAGNOSIS — S80212A Abrasion, left knee, initial encounter: Secondary | ICD-10-CM | POA: Diagnosis not present

## 2016-03-22 DIAGNOSIS — S299XXA Unspecified injury of thorax, initial encounter: Secondary | ICD-10-CM | POA: Diagnosis not present

## 2016-03-22 DIAGNOSIS — S6992XA Unspecified injury of left wrist, hand and finger(s), initial encounter: Secondary | ICD-10-CM | POA: Diagnosis not present

## 2016-03-22 DIAGNOSIS — N390 Urinary tract infection, site not specified: Secondary | ICD-10-CM | POA: Diagnosis not present

## 2016-03-22 DIAGNOSIS — R109 Unspecified abdominal pain: Secondary | ICD-10-CM | POA: Diagnosis not present

## 2016-03-22 DIAGNOSIS — G319 Degenerative disease of nervous system, unspecified: Secondary | ICD-10-CM | POA: Diagnosis not present

## 2016-03-22 DIAGNOSIS — R079 Chest pain, unspecified: Secondary | ICD-10-CM | POA: Diagnosis not present

## 2016-03-22 DIAGNOSIS — Z888 Allergy status to other drugs, medicaments and biological substances status: Secondary | ICD-10-CM | POA: Diagnosis not present

## 2016-03-22 DIAGNOSIS — M79622 Pain in left upper arm: Secondary | ICD-10-CM | POA: Diagnosis not present

## 2016-03-22 DIAGNOSIS — S0990XA Unspecified injury of head, initial encounter: Secondary | ICD-10-CM | POA: Diagnosis not present

## 2016-03-22 NOTE — Telephone Encounter (Signed)
Per message left by the patient, she has been making frequent trips to the bathroom, every 5-10 minutes. She can be reached at [phone]. Please advise. Thanks, MI

## 2016-03-22 NOTE — Telephone Encounter (Signed)
lmtc

## 2016-03-26 NOTE — Telephone Encounter (Signed)
Left message for pt to call.

## 2016-03-26 NOTE — Telephone Encounter (Signed)
Follow-up        Returning a from [place] but per pt just wanted the nurse not to return her phone call, the pt doesn't feel like speaking with anyone today.
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
## 2016-04-15 ENCOUNTER — Other Ambulatory Visit: Payer: Self-pay | Admitting: Cardiology

## 2016-04-15 NOTE — Telephone Encounter (Signed)
Rx(s) sent to pharmacy electronically.  

## 2016-04-29 ENCOUNTER — Ambulatory Visit (INDEPENDENT_AMBULATORY_CARE_PROVIDER_SITE_OTHER): Payer: Medicare Other | Admitting: Neurology

## 2016-04-29 ENCOUNTER — Encounter: Payer: Self-pay | Admitting: Neurology

## 2016-04-29 VITALS — BP 112/72 | HR 80 | Resp 16 | Ht 62.0 in | Wt 100.0 lb

## 2016-04-29 DIAGNOSIS — M7989 Other specified soft tissue disorders: Secondary | ICD-10-CM

## 2016-04-29 DIAGNOSIS — F419 Anxiety disorder, unspecified: Secondary | ICD-10-CM

## 2016-04-29 DIAGNOSIS — G20 Parkinson's disease: Secondary | ICD-10-CM

## 2016-04-29 DIAGNOSIS — G249 Dystonia, unspecified: Secondary | ICD-10-CM | POA: Diagnosis not present

## 2016-04-29 DIAGNOSIS — J3089 Other allergic rhinitis: Secondary | ICD-10-CM | POA: Diagnosis not present

## 2016-04-29 MED ORDER — ALPRAZOLAM 0.5 MG PO TABS: 0.5000 mg | ORAL_TABLET | ORAL | Status: AC | PRN

## 2016-04-29 MED ORDER — CARBIDOPA-LEVODOPA-ENTACAPONE 37.5-150-200 MG PO TABS: ORAL_TABLET | ORAL | Status: AC

## 2016-04-29 NOTE — Patient Instructions (Signed)
We will monitor your leg swelling, you may have some fluid build up in the lungs on my auscultation today. Please take your Lasix consistently and keep your follow up with Dr. Martinique next week.  Requip can cause swelling but you are on such a low dose, I would be surprised if it caused swelling.
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
## 2016-05-06 DIAGNOSIS — A419 Sepsis, unspecified organism: Secondary | ICD-10-CM | POA: Diagnosis not present

## 2016-05-06 DIAGNOSIS — D649 Anemia, unspecified: Secondary | ICD-10-CM | POA: Diagnosis present

## 2016-05-06 DIAGNOSIS — J9601 Acute respiratory failure with hypoxia: Secondary | ICD-10-CM | POA: Diagnosis not present

## 2016-05-06 DIAGNOSIS — G20 Parkinson's disease: Secondary | ICD-10-CM | POA: Diagnosis not present

## 2016-05-06 DIAGNOSIS — J45909 Unspecified asthma, uncomplicated: Secondary | ICD-10-CM | POA: Diagnosis present

## 2016-05-06 DIAGNOSIS — J181 Lobar pneumonia, unspecified organism: Secondary | ICD-10-CM | POA: Diagnosis not present

## 2016-05-06 DIAGNOSIS — R0902 Hypoxemia: Secondary | ICD-10-CM | POA: Diagnosis not present

## 2016-05-06 DIAGNOSIS — G92 Toxic encephalopathy: Secondary | ICD-10-CM | POA: Diagnosis not present

## 2016-05-06 DIAGNOSIS — E785 Hyperlipidemia, unspecified: Secondary | ICD-10-CM | POA: Diagnosis present

## 2016-05-06 DIAGNOSIS — R0602 Shortness of breath: Secondary | ICD-10-CM | POA: Diagnosis not present

## 2016-05-06 DIAGNOSIS — F0280 Dementia in other diseases classified elsewhere without behavioral disturbance: Secondary | ICD-10-CM | POA: Diagnosis present

## 2016-05-06 DIAGNOSIS — Z66 Do not resuscitate: Secondary | ICD-10-CM | POA: Diagnosis not present

## 2016-05-06 DIAGNOSIS — Z79899 Other long term (current) drug therapy: Secondary | ICD-10-CM | POA: Diagnosis not present

## 2016-05-06 DIAGNOSIS — D72829 Elevated white blood cell count, unspecified: Secondary | ICD-10-CM | POA: Diagnosis not present

## 2016-05-06 DIAGNOSIS — J189 Pneumonia, unspecified organism: Secondary | ICD-10-CM | POA: Diagnosis not present

## 2016-05-06 DIAGNOSIS — F419 Anxiety disorder, unspecified: Secondary | ICD-10-CM | POA: Diagnosis not present

## 2016-05-06 DIAGNOSIS — I509 Heart failure, unspecified: Secondary | ICD-10-CM | POA: Diagnosis not present

## 2016-05-06 DIAGNOSIS — Z8673 Personal history of transient ischemic attack (TIA), and cerebral infarction without residual deficits: Secondary | ICD-10-CM | POA: Diagnosis not present

## 2016-05-06 DIAGNOSIS — R05 Cough: Secondary | ICD-10-CM | POA: Diagnosis not present

## 2016-05-07 DIAGNOSIS — G20 Parkinson's disease: Secondary | ICD-10-CM

## 2016-05-07 DIAGNOSIS — Z66 Do not resuscitate: Secondary | ICD-10-CM

## 2016-05-08 DIAGNOSIS — J9601 Acute respiratory failure with hypoxia: Secondary | ICD-10-CM

## 2016-05-08 DIAGNOSIS — G92 Toxic encephalopathy: Secondary | ICD-10-CM

## 2016-05-08 DIAGNOSIS — J189 Pneumonia, unspecified organism: Secondary | ICD-10-CM

## 2016-05-08 DIAGNOSIS — G20 Parkinson's disease: Secondary | ICD-10-CM

## 2016-05-10 ENCOUNTER — Encounter: Payer: Self-pay | Admitting: Cardiology

## 2016-05-10 ENCOUNTER — Telehealth: Payer: Self-pay

## 2016-05-10 ENCOUNTER — Ambulatory Visit (INDEPENDENT_AMBULATORY_CARE_PROVIDER_SITE_OTHER): Payer: Medicare Other | Admitting: Cardiology

## 2016-05-10 VITALS — BP 114/74 | HR 83 | Ht 63.0 in | Wt 104.0 lb

## 2016-05-10 DIAGNOSIS — I5032 Chronic diastolic (congestive) heart failure: Secondary | ICD-10-CM

## 2016-05-10 DIAGNOSIS — I341 Nonrheumatic mitral (valve) prolapse: Secondary | ICD-10-CM

## 2016-05-10 DIAGNOSIS — I509 Heart failure, unspecified: Secondary | ICD-10-CM | POA: Diagnosis not present

## 2016-05-10 DIAGNOSIS — M1991 Primary osteoarthritis, unspecified site: Secondary | ICD-10-CM | POA: Diagnosis not present

## 2016-05-10 DIAGNOSIS — Z8673 Personal history of transient ischemic attack (TIA), and cerebral infarction without residual deficits: Secondary | ICD-10-CM | POA: Diagnosis not present

## 2016-05-10 DIAGNOSIS — Z79891 Long term (current) use of opiate analgesic: Secondary | ICD-10-CM | POA: Diagnosis not present

## 2016-05-10 DIAGNOSIS — G20 Parkinson's disease: Secondary | ICD-10-CM | POA: Diagnosis not present

## 2016-05-10 DIAGNOSIS — I5031 Acute diastolic (congestive) heart failure: Secondary | ICD-10-CM

## 2016-05-10 DIAGNOSIS — J45909 Unspecified asthma, uncomplicated: Secondary | ICD-10-CM | POA: Diagnosis not present

## 2016-05-10 DIAGNOSIS — I340 Nonrheumatic mitral (valve) insufficiency: Secondary | ICD-10-CM | POA: Diagnosis not present

## 2016-05-10 DIAGNOSIS — F419 Anxiety disorder, unspecified: Secondary | ICD-10-CM | POA: Diagnosis not present

## 2016-05-10 DIAGNOSIS — F329 Major depressive disorder, single episode, unspecified: Secondary | ICD-10-CM | POA: Diagnosis not present

## 2016-05-10 DIAGNOSIS — J189 Pneumonia, unspecified organism: Secondary | ICD-10-CM | POA: Diagnosis not present

## 2016-05-10 NOTE — Patient Instructions (Signed)
Take lasix 40 mg twice a day for 3 days then resume once a day  Continue your other therapy

## 2016-05-10 NOTE — Telephone Encounter (Signed)
Received a call from Felicie Morn with [facility] requesting copy of 05/10/16 office visit with Dr.Jordan.Copy of office note faxed to her at fax # [phone].

## 2016-05-10 NOTE — Progress Notes (Signed)
Cardiology Office Note   Date:  05/10/2016   ID:  Jill Hamilton, DOB July 29, 1934, MRN [id number]  PCP:  Jill Ringer, MD  Cardiologist:   Peter Martinique, MD   No chief complaint on file.     History of Present Illness: Jill Hamilton is a 82 y.o. female is seen for follow up of CHF.  She has CHF related to diastolic dysfunction and MVP with severe MR. She is not a surgical candidate.  On follow up today she reports she was just discharged from [facility] yesterday after 3 days for treatment of PNA. She was given antibiotics and IVF. She has noted more edema. Weight is up 4 lbs. Still has some cough.  Echo in May 2016 showed normal LV function with flail portion of the anterior leaflet and moderate to severe MR.   Past Medical History  Diagnosis Date  . Hyperlipidemia   . Parkinson's disease ([place])   . Scoliosis   . Asthma   . Arthritis   . Anxiety   . Osteoporosis   . H/O seasonal allergies   . Mitral valve prolapse   . Moderate mitral insufficiency     Past Surgical History  Procedure Laterality Date  . Abdominal hysterectomy    . Cholecystectomy    . Carpal tunnel release      bilateral  . Hammer toe surgery      bilateral  . Esophagogastroduodenoscopy  08/12/12  . Appendectomy      with hysterectomy  . Bladder surgery      bladder tacked  . Hiatal hernia repair  12/15/2012    laparascopic repair  . Hiatal hernia repair  12/15/2012    Procedure: LAPAROSCOPIC REPAIR OF HIATAL HERNIA;  Surgeon: [name], MD;  Location: [facility];  Service: General;;     Current Outpatient Prescriptions  Medication Sig Dispense Refill  . albuterol (PROVENTIL HFA;VENTOLIN HFA) 108 (90 BASE) MCG/ACT inhaler Inhale 2 puffs into the lungs every 6 (six) hours as needed. For shortness of breath    . ALPRAZolam (XANAX) 0.5 MG tablet Take 1 tablet (0.5 mg total) by mouth as needed for anxiety. 30 tablet 3  . amoxicillin (AMOXIL) 500 MG capsule Taken when going to dentist     . brimonidine (ALPHAGAN) 0.15 % ophthalmic solution Place 1 drop into both eyes daily.  6  . carbidopa-levodopa-entacapone (STALEVO) 37.5-150-200 MG tablet TAKE ONE TABLET FIVE TIMES A DAY. 7AM, 10AM, 1PM, AT 4 PM, AND 7PM 150 tablet 5  . citalopram (CELEXA) 40 MG tablet TAKE ONE TABLET BY MOUTH EVERY MORNING 90 tablet 0  . ezetimibe (ZETIA) 10 MG tablet Take 10 mg by mouth at bedtime.     . fluticasone (FLONASE) 50 MCG/ACT nasal spray As needed  6  . furosemide (LASIX) 40 MG tablet Take 1 tablet (40 mg total) by mouth daily. 60 tablet 6  . furosemide (LASIX) 40 MG tablet TAKE ONE TABLET BY MOUTH TWICE DAILY 60 tablet 10  . HYDROcodone-acetaminophen (NORCO/VICODIN) 5-325 MG per tablet Take 1 tablet by mouth every 6 (six) hours as needed. For pain    . omeprazole (PRILOSEC) 40 MG capsule Take 1 capsule (40 mg total) by mouth every morning. 80 capsule 3  . potassium chloride SA (K-DUR,KLOR-CON) 20 MEQ tablet TAKE ONE TABLET BY MOUTH TWICE DAILY 60 tablet 10  . prednisoLONE acetate (PRED FORTE) 1 % ophthalmic suspension     . predniSONE (DELTASONE) 10 MG tablet Take 10 tablets by  mouth taper from 4 doses each day to 1 dose and stop.  0  . PREVIDENT 5000 DRY MOUTH 1.1 % GEL dental gel Take 1 mL by mouth See admin instructions.  0  . rOPINIRole (REQUIP) 0.25 MG tablet Take 0.25 mg by mouth at bedtime. Prn take 1 tab daily at bedtime  3  . traMADol (ULTRAM) 50 MG tablet Take 1 tablet by mouth as needed.    . TRAVATAN Z 0.004 % SOLN ophthalmic solution daily. Both eyes     No current facility-administered medications for this visit.    Allergies:   Iodine and Iohexol    Social History:  The patient  reports that she has never smoked. She has never used smokeless tobacco. She reports that she does not drink alcohol or use illicit drugs.   Family History:  The patient's family history includes Heart disease in her father.    ROS:  Please see the history of present illness.      All other  systems are reviewed and negative.    PHYSICAL EXAM: VS:  There were no vitals taken for this visit. , BMI There is no weight on file to calculate BMI. GEN: elderly WF in no distress. She appears frail.  HEENT: normal Neck: no JVD. No carotid bruits, or masses Cardiac: RRR;  Normal S1-2. Loud 3/6 holosystolic murmur at the apex radiating to left axilla. She has 1+ LE edema Respiratory:  Coarse bilateral rhonchi. GI: soft, nontender, nondistended, + BS MS: no deformity or atrophy Skin: warm and dry, no rash. No edema. Neuro:  Strength and sensation are intact Psych: euthymic mood, full affect   EKG:  EKG is  ordered today. NSR rate 83. Possible old septal infarct. I have personally reviewed and interpreted this study.    Recent Labs: No results found for requested labs within last 365 days.    Lipid Panel No results found for: CHOL, TRIG, HDL, CHOLHDL, VLDL, LDLCALC, LDLDIRECT    Wt Readings from Last 3 Encounters:  04/29/16 100 lb (45.36 kg)  01/02/16 99 lb 8 oz (45.133 kg)  10/04/15 98 lb 6 oz (44.623 kg)      Other studies Reviewed: Echo 04/06/15:Study Conclusions  - Left ventricle: The cavity size was normal. Wall thickness was normal. Systolic function was normal. The estimated ejection fraction was in the range of 60% to 65%. Wall motion was normal; there were no regional wall motion abnormalities. Left ventricular diastolic function parameters were normal. - Aortic valve: Trileaflet; mildly calcified leaflets, especially the non-coronary cusp. There was no regurgitation. - Mitral valve: Mildly thickened leaflets. Mild stenosis due to leaflet prolapse. There is a flail chord of the anterior leaflet, seen prolapsing with the leaflet across the valve plane. Thare is moderate to severe posteriorly directed regurgitation. Valve area by continuity equation (using LVOT flow): 1.98 cm^2. - Left atrium: Severely dilated at 65 ml/m2. - Right ventricle:  The cavity size was mildly dilated. - Right atrium: The atrium was mildly dilated. - Tricuspid valve: There was moderate regurgitation. - Pulmonary arteries: PA peak pressure: 64 mm Hg (S). - Inferior vena cava: The vessel was normal in size. The respirophasic diameter changes were in the normal range (= 50%), consistent with normal central venous pressure.  Impressions:  - LVEF 60-65%, normal wall motion, normal wall thickness, thickened mitral leaflets with a flail segment of the anterior leaflet and possible mass of the leaflet, with flail chord that prolapses in and out of the valve plane.  There is moderate to severe, posteriorly directed regurgitation. There is calcification of the non-coronary cuse of the aortic valve, without stenosis. There is moderate TR with moderate pulmonary hypertension at 64 mmHg.    ASSESSMENT AND PLAN:  1.  Chronic diastolic CHF - secondary  to valvular heart disease with mitral insufficiency. LV function is normal. Appears volume overloaded today with weight up about 4 lbs. May be related to recent PNA and IVF.  Will increase lasix to 40 mg bid for 3 days then resume daily.   2. 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 Ideally we would consider MV repair but she is a frail, thin elderly female with decreased functional status, parkinson's disease, and scoliosis. She is  high risk for surgical repair. She is doing well on medical therapy. Continue same.   3. CAD. Apparent negative cath 20 yrs ago but chest CT in 2010 showed significant coronary calcification.   4. Asthma.  5. Hyperlipidemia. On Zetia.   6. Parkinson's disease.    Current medicines are reviewed at length with the patient today.  The patient does not have concerns regarding medicines.  The following changes have been made:  See above  Labs/ tests ordered today include:   No orders of the defined types were placed in  this encounter.     Disposition:   FU in 6 months   Signed, Peter Martinique, MD,FACC  05/10/2016 1:22 PM    [facility] [address], [place], [place], [address] Phone [phone], Fax [phone]

## 2016-05-13 DIAGNOSIS — M1991 Primary osteoarthritis, unspecified site: Secondary | ICD-10-CM | POA: Diagnosis not present

## 2016-05-13 DIAGNOSIS — I509 Heart failure, unspecified: Secondary | ICD-10-CM | POA: Diagnosis not present

## 2016-05-13 DIAGNOSIS — F329 Major depressive disorder, single episode, unspecified: Secondary | ICD-10-CM | POA: Diagnosis not present

## 2016-05-13 DIAGNOSIS — G20 Parkinson's disease: Secondary | ICD-10-CM | POA: Diagnosis not present

## 2016-05-13 DIAGNOSIS — J45909 Unspecified asthma, uncomplicated: Secondary | ICD-10-CM | POA: Diagnosis not present

## 2016-05-13 DIAGNOSIS — J189 Pneumonia, unspecified organism: Secondary | ICD-10-CM | POA: Diagnosis not present

## 2016-05-14 DIAGNOSIS — F329 Major depressive disorder, single episode, unspecified: Secondary | ICD-10-CM | POA: Diagnosis not present

## 2016-05-14 DIAGNOSIS — J45909 Unspecified asthma, uncomplicated: Secondary | ICD-10-CM | POA: Diagnosis not present

## 2016-05-14 DIAGNOSIS — I509 Heart failure, unspecified: Secondary | ICD-10-CM | POA: Diagnosis not present

## 2016-05-14 DIAGNOSIS — G20 Parkinson's disease: Secondary | ICD-10-CM | POA: Diagnosis not present

## 2016-05-14 DIAGNOSIS — M1991 Primary osteoarthritis, unspecified site: Secondary | ICD-10-CM | POA: Diagnosis not present

## 2016-05-14 DIAGNOSIS — J189 Pneumonia, unspecified organism: Secondary | ICD-10-CM | POA: Diagnosis not present

## 2016-05-16 DIAGNOSIS — F329 Major depressive disorder, single episode, unspecified: Secondary | ICD-10-CM | POA: Diagnosis not present

## 2016-05-16 DIAGNOSIS — I509 Heart failure, unspecified: Secondary | ICD-10-CM | POA: Diagnosis not present

## 2016-05-16 DIAGNOSIS — J45909 Unspecified asthma, uncomplicated: Secondary | ICD-10-CM | POA: Diagnosis not present

## 2016-05-16 DIAGNOSIS — G20 Parkinson's disease: Secondary | ICD-10-CM | POA: Diagnosis not present

## 2016-05-16 DIAGNOSIS — J189 Pneumonia, unspecified organism: Secondary | ICD-10-CM | POA: Diagnosis not present

## 2016-05-16 DIAGNOSIS — M1991 Primary osteoarthritis, unspecified site: Secondary | ICD-10-CM | POA: Diagnosis not present

## 2016-05-21 DIAGNOSIS — M1991 Primary osteoarthritis, unspecified site: Secondary | ICD-10-CM | POA: Diagnosis not present

## 2016-05-21 DIAGNOSIS — I509 Heart failure, unspecified: Secondary | ICD-10-CM | POA: Diagnosis not present

## 2016-05-21 DIAGNOSIS — J45909 Unspecified asthma, uncomplicated: Secondary | ICD-10-CM | POA: Diagnosis not present

## 2016-05-21 DIAGNOSIS — J189 Pneumonia, unspecified organism: Secondary | ICD-10-CM | POA: Diagnosis not present

## 2016-05-21 DIAGNOSIS — F329 Major depressive disorder, single episode, unspecified: Secondary | ICD-10-CM | POA: Diagnosis not present

## 2016-05-21 DIAGNOSIS — G20 Parkinson's disease: Secondary | ICD-10-CM | POA: Diagnosis not present

## 2016-05-23 DIAGNOSIS — I509 Heart failure, unspecified: Secondary | ICD-10-CM | POA: Diagnosis not present

## 2016-05-23 DIAGNOSIS — F329 Major depressive disorder, single episode, unspecified: Secondary | ICD-10-CM | POA: Diagnosis not present

## 2016-05-23 DIAGNOSIS — J45909 Unspecified asthma, uncomplicated: Secondary | ICD-10-CM | POA: Diagnosis not present

## 2016-05-23 DIAGNOSIS — J189 Pneumonia, unspecified organism: Secondary | ICD-10-CM | POA: Diagnosis not present

## 2016-05-23 DIAGNOSIS — M1991 Primary osteoarthritis, unspecified site: Secondary | ICD-10-CM | POA: Diagnosis not present

## 2016-05-23 DIAGNOSIS — G20 Parkinson's disease: Secondary | ICD-10-CM | POA: Diagnosis not present

## 2016-05-24 DIAGNOSIS — I509 Heart failure, unspecified: Secondary | ICD-10-CM | POA: Diagnosis not present

## 2016-05-24 DIAGNOSIS — G92 Toxic encephalopathy: Secondary | ICD-10-CM | POA: Diagnosis not present

## 2016-05-24 DIAGNOSIS — J189 Pneumonia, unspecified organism: Secondary | ICD-10-CM | POA: Diagnosis not present

## 2016-05-24 DIAGNOSIS — M1991 Primary osteoarthritis, unspecified site: Secondary | ICD-10-CM | POA: Diagnosis not present

## 2016-05-24 DIAGNOSIS — F329 Major depressive disorder, single episode, unspecified: Secondary | ICD-10-CM | POA: Diagnosis not present

## 2016-05-24 DIAGNOSIS — J9601 Acute respiratory failure with hypoxia: Secondary | ICD-10-CM | POA: Diagnosis not present

## 2016-05-24 DIAGNOSIS — G20 Parkinson's disease: Secondary | ICD-10-CM | POA: Diagnosis not present

## 2016-05-24 DIAGNOSIS — I5032 Chronic diastolic (congestive) heart failure: Secondary | ICD-10-CM | POA: Diagnosis not present

## 2016-05-24 DIAGNOSIS — J45909 Unspecified asthma, uncomplicated: Secondary | ICD-10-CM | POA: Diagnosis not present

## 2016-05-27 DIAGNOSIS — G20 Parkinson's disease: Secondary | ICD-10-CM | POA: Diagnosis not present

## 2016-05-27 DIAGNOSIS — I509 Heart failure, unspecified: Secondary | ICD-10-CM | POA: Diagnosis not present

## 2016-05-27 DIAGNOSIS — F329 Major depressive disorder, single episode, unspecified: Secondary | ICD-10-CM | POA: Diagnosis not present

## 2016-05-27 DIAGNOSIS — M1991 Primary osteoarthritis, unspecified site: Secondary | ICD-10-CM | POA: Diagnosis not present

## 2016-05-27 DIAGNOSIS — J45909 Unspecified asthma, uncomplicated: Secondary | ICD-10-CM | POA: Diagnosis not present

## 2016-05-27 DIAGNOSIS — J189 Pneumonia, unspecified organism: Secondary | ICD-10-CM | POA: Diagnosis not present

## 2016-05-28 DIAGNOSIS — J45909 Unspecified asthma, uncomplicated: Secondary | ICD-10-CM | POA: Diagnosis not present

## 2016-05-28 DIAGNOSIS — M1991 Primary osteoarthritis, unspecified site: Secondary | ICD-10-CM | POA: Diagnosis not present

## 2016-05-28 DIAGNOSIS — F329 Major depressive disorder, single episode, unspecified: Secondary | ICD-10-CM | POA: Diagnosis not present

## 2016-05-28 DIAGNOSIS — J189 Pneumonia, unspecified organism: Secondary | ICD-10-CM | POA: Diagnosis not present

## 2016-05-28 DIAGNOSIS — I509 Heart failure, unspecified: Secondary | ICD-10-CM | POA: Diagnosis not present

## 2016-05-28 DIAGNOSIS — H401131 Primary open-angle glaucoma, bilateral, mild stage: Secondary | ICD-10-CM | POA: Diagnosis not present

## 2016-05-28 DIAGNOSIS — G20 Parkinson's disease: Secondary | ICD-10-CM | POA: Diagnosis not present

## 2016-05-30 DIAGNOSIS — M1991 Primary osteoarthritis, unspecified site: Secondary | ICD-10-CM | POA: Diagnosis not present

## 2016-05-30 DIAGNOSIS — J45909 Unspecified asthma, uncomplicated: Secondary | ICD-10-CM | POA: Diagnosis not present

## 2016-05-30 DIAGNOSIS — I509 Heart failure, unspecified: Secondary | ICD-10-CM | POA: Diagnosis not present

## 2016-05-30 DIAGNOSIS — G20 Parkinson's disease: Secondary | ICD-10-CM | POA: Diagnosis not present

## 2016-05-30 DIAGNOSIS — F329 Major depressive disorder, single episode, unspecified: Secondary | ICD-10-CM | POA: Diagnosis not present

## 2016-05-30 DIAGNOSIS — J189 Pneumonia, unspecified organism: Secondary | ICD-10-CM | POA: Diagnosis not present

## 2016-06-03 DIAGNOSIS — J189 Pneumonia, unspecified organism: Secondary | ICD-10-CM | POA: Diagnosis not present

## 2016-06-03 DIAGNOSIS — G20 Parkinson's disease: Secondary | ICD-10-CM | POA: Diagnosis not present

## 2016-06-03 DIAGNOSIS — F329 Major depressive disorder, single episode, unspecified: Secondary | ICD-10-CM | POA: Diagnosis not present

## 2016-06-03 DIAGNOSIS — I509 Heart failure, unspecified: Secondary | ICD-10-CM | POA: Diagnosis not present

## 2016-06-03 DIAGNOSIS — J45909 Unspecified asthma, uncomplicated: Secondary | ICD-10-CM | POA: Diagnosis not present

## 2016-06-03 DIAGNOSIS — M1991 Primary osteoarthritis, unspecified site: Secondary | ICD-10-CM | POA: Diagnosis not present

## 2016-06-06 DIAGNOSIS — J189 Pneumonia, unspecified organism: Secondary | ICD-10-CM | POA: Diagnosis not present

## 2016-06-06 DIAGNOSIS — I509 Heart failure, unspecified: Secondary | ICD-10-CM | POA: Diagnosis not present

## 2016-06-06 DIAGNOSIS — G20 Parkinson's disease: Secondary | ICD-10-CM | POA: Diagnosis not present

## 2016-06-06 DIAGNOSIS — F329 Major depressive disorder, single episode, unspecified: Secondary | ICD-10-CM | POA: Diagnosis not present

## 2016-06-06 DIAGNOSIS — M1991 Primary osteoarthritis, unspecified site: Secondary | ICD-10-CM | POA: Diagnosis not present

## 2016-06-06 DIAGNOSIS — J45909 Unspecified asthma, uncomplicated: Secondary | ICD-10-CM | POA: Diagnosis not present

## 2016-06-11 DIAGNOSIS — J45909 Unspecified asthma, uncomplicated: Secondary | ICD-10-CM | POA: Diagnosis not present

## 2016-06-11 DIAGNOSIS — J189 Pneumonia, unspecified organism: Secondary | ICD-10-CM | POA: Diagnosis not present

## 2016-06-11 DIAGNOSIS — I509 Heart failure, unspecified: Secondary | ICD-10-CM | POA: Diagnosis not present

## 2016-06-11 DIAGNOSIS — F329 Major depressive disorder, single episode, unspecified: Secondary | ICD-10-CM | POA: Diagnosis not present

## 2016-06-11 DIAGNOSIS — M1991 Primary osteoarthritis, unspecified site: Secondary | ICD-10-CM | POA: Diagnosis not present

## 2016-06-11 DIAGNOSIS — G20 Parkinson's disease: Secondary | ICD-10-CM | POA: Diagnosis not present

## 2016-06-15 DIAGNOSIS — S199XXA Unspecified injury of neck, initial encounter: Secondary | ICD-10-CM | POA: Diagnosis not present

## 2016-06-15 DIAGNOSIS — S0990XA Unspecified injury of head, initial encounter: Secondary | ICD-10-CM | POA: Diagnosis not present

## 2016-06-15 DIAGNOSIS — S0101XA Laceration without foreign body of scalp, initial encounter: Secondary | ICD-10-CM | POA: Diagnosis not present

## 2016-06-18 DIAGNOSIS — J189 Pneumonia, unspecified organism: Secondary | ICD-10-CM | POA: Diagnosis not present

## 2016-06-18 DIAGNOSIS — M1991 Primary osteoarthritis, unspecified site: Secondary | ICD-10-CM | POA: Diagnosis not present

## 2016-06-18 DIAGNOSIS — G20 Parkinson's disease: Secondary | ICD-10-CM | POA: Diagnosis not present

## 2016-06-18 DIAGNOSIS — J45909 Unspecified asthma, uncomplicated: Secondary | ICD-10-CM | POA: Diagnosis not present

## 2016-06-18 DIAGNOSIS — I509 Heart failure, unspecified: Secondary | ICD-10-CM | POA: Diagnosis not present

## 2016-06-18 DIAGNOSIS — F329 Major depressive disorder, single episode, unspecified: Secondary | ICD-10-CM | POA: Diagnosis not present

## 2016-06-24 DIAGNOSIS — J189 Pneumonia, unspecified organism: Secondary | ICD-10-CM | POA: Diagnosis not present

## 2016-06-24 DIAGNOSIS — N390 Urinary tract infection, site not specified: Secondary | ICD-10-CM | POA: Diagnosis not present

## 2016-06-24 DIAGNOSIS — I5032 Chronic diastolic (congestive) heart failure: Secondary | ICD-10-CM | POA: Diagnosis not present

## 2016-06-24 DIAGNOSIS — I340 Nonrheumatic mitral (valve) insufficiency: Secondary | ICD-10-CM | POA: Diagnosis not present

## 2016-06-24 DIAGNOSIS — G20 Parkinson's disease: Secondary | ICD-10-CM | POA: Diagnosis not present

## 2016-06-28 ENCOUNTER — Emergency Department (HOSPITAL_COMMUNITY): Payer: Medicare Other

## 2016-06-28 ENCOUNTER — Encounter (HOSPITAL_COMMUNITY): Payer: Self-pay | Admitting: Emergency Medicine

## 2016-06-28 ENCOUNTER — Observation Stay (HOSPITAL_COMMUNITY)
Admission: EM | Admit: 2016-06-28 | Discharge: 2016-06-29 | Disposition: A | Discharge status: Home / Self Care | Payer: Medicare Other | Attending: Internal Medicine | Admitting: Internal Medicine

## 2016-06-28 DIAGNOSIS — T675XXA Heat exhaustion, unspecified, initial encounter: Secondary | ICD-10-CM | POA: Diagnosis not present

## 2016-06-28 DIAGNOSIS — R609 Edema, unspecified: Secondary | ICD-10-CM | POA: Diagnosis not present

## 2016-06-28 DIAGNOSIS — D649 Anemia, unspecified: Secondary | ICD-10-CM | POA: Insufficient documentation

## 2016-06-28 DIAGNOSIS — M1990 Unspecified osteoarthritis, unspecified site: Secondary | ICD-10-CM | POA: Insufficient documentation

## 2016-06-28 DIAGNOSIS — I341 Nonrheumatic mitral (valve) prolapse: Secondary | ICD-10-CM | POA: Diagnosis not present

## 2016-06-28 DIAGNOSIS — I6789 Other cerebrovascular disease: Secondary | ICD-10-CM | POA: Diagnosis not present

## 2016-06-28 DIAGNOSIS — G20 Parkinson's disease: Secondary | ICD-10-CM | POA: Insufficient documentation

## 2016-06-28 DIAGNOSIS — E785 Hyperlipidemia, unspecified: Secondary | ICD-10-CM | POA: Insufficient documentation

## 2016-06-28 DIAGNOSIS — M419 Scoliosis, unspecified: Secondary | ICD-10-CM | POA: Insufficient documentation

## 2016-06-28 DIAGNOSIS — N182 Chronic kidney disease, stage 2 (mild): Secondary | ICD-10-CM | POA: Insufficient documentation

## 2016-06-28 DIAGNOSIS — I340 Nonrheumatic mitral (valve) insufficiency: Secondary | ICD-10-CM | POA: Diagnosis present

## 2016-06-28 DIAGNOSIS — E860 Dehydration: Secondary | ICD-10-CM

## 2016-06-28 DIAGNOSIS — F419 Anxiety disorder, unspecified: Secondary | ICD-10-CM | POA: Insufficient documentation

## 2016-06-28 DIAGNOSIS — R4182 Altered mental status, unspecified: Secondary | ICD-10-CM | POA: Diagnosis present

## 2016-06-28 DIAGNOSIS — J45909 Unspecified asthma, uncomplicated: Secondary | ICD-10-CM | POA: Diagnosis not present

## 2016-06-28 DIAGNOSIS — X58XXXA Exposure to other specified factors, initial encounter: Secondary | ICD-10-CM | POA: Diagnosis not present

## 2016-06-28 DIAGNOSIS — R011 Cardiac murmur, unspecified: Secondary | ICD-10-CM | POA: Diagnosis present

## 2016-06-28 DIAGNOSIS — F4489 Other dissociative and conversion disorders: Secondary | ICD-10-CM | POA: Diagnosis not present

## 2016-06-28 DIAGNOSIS — I5033 Acute on chronic diastolic (congestive) heart failure: Secondary | ICD-10-CM | POA: Insufficient documentation

## 2016-06-28 DIAGNOSIS — N179 Acute kidney failure, unspecified: Secondary | ICD-10-CM | POA: Diagnosis not present

## 2016-06-28 DIAGNOSIS — I5032 Chronic diastolic (congestive) heart failure: Secondary | ICD-10-CM | POA: Diagnosis present

## 2016-06-28 LAB — TROPONIN I
Troponin I: 0.03 ng/mL (ref <0.03)
Troponin I: 0.04 ng/mL (ref <0.03)

## 2016-06-28 LAB — COMPREHENSIVE METABOLIC PANEL
ALT: 6 U/L — AB (ref 14–54)
ANION GAP: 9 (ref 5–15)
AST: 36 U/L (ref 15–41)
Albumin: 3.1 g/dL — ABNORMAL LOW (ref 3.5–5.0)
Alkaline Phosphatase: 89 U/L (ref 38–126)
BUN: 29 mg/dL — ABNORMAL HIGH (ref 6–20)
CO2: 24 mmol/L (ref 22–32)
Calcium: 8.9 mg/dL (ref 8.9–10.3)
Chloride: 106 mmol/L (ref 101–111)
Creatinine, Ser: 1.16 mg/dL — ABNORMAL HIGH (ref 0.44–1.00)
GFR, EST AFRICAN AMERICAN: 51 mL/min — AB (ref >60)
GFR, EST NON AFRICAN AMERICAN: 44 mL/min — AB (ref >60)
Glucose, Bld: 77 mg/dL (ref 65–99)
POTASSIUM: 4.3 mmol/L (ref 3.5–5.1)
SODIUM: 139 mmol/L (ref 135–145)
Total Bilirubin: 0.8 mg/dL (ref 0.3–1.2)
Total Protein: 6.4 g/dL — ABNORMAL LOW (ref 6.5–8.1)

## 2016-06-28 LAB — CBC WITH DIFFERENTIAL/PLATELET
Basophils Absolute: 0 10*3/uL (ref 0.0–0.1)
Basophils Relative: 1 %
EOS ABS: 0.2 10*3/uL (ref 0.0–0.7)
Eosinophils Relative: 3 %
HEMATOCRIT: 32.5 % — AB (ref 36.0–46.0)
HEMOGLOBIN: 9.9 g/dL — AB (ref 12.0–15.0)
LYMPHS ABS: 1.1 10*3/uL (ref 0.7–4.0)
LYMPHS PCT: 19 %
MCH: 27.5 pg (ref 26.0–34.0)
MCHC: 30.5 g/dL (ref 30.0–36.0)
MCV: 90.3 fL (ref 78.0–100.0)
MONOS PCT: 12 %
Monocytes Absolute: 0.7 10*3/uL (ref 0.1–1.0)
NEUTROS ABS: 3.7 10*3/uL (ref 1.7–7.7)
Neutrophils Relative %: 65 %
Platelets: 219 10*3/uL (ref 150–400)
RBC: 3.6 MIL/uL — AB (ref 3.87–5.11)
RDW: 15.2 % (ref 11.5–15.5)
WBC: 5.6 10*3/uL (ref 4.0–10.5)

## 2016-06-28 LAB — I-STAT CG4 LACTIC ACID, ED: Lactic Acid, Venous: 1.17 mmol/L (ref 0.5–1.9)

## 2016-06-28 LAB — URINALYSIS, ROUTINE W REFLEX MICROSCOPIC
Bilirubin Urine: NEGATIVE
GLUCOSE, UA: NEGATIVE mg/dL
Hgb urine dipstick: NEGATIVE
Ketones, ur: 15 mg/dL — AB
LEUKOCYTES UA: NEGATIVE
NITRITE: NEGATIVE
PH: 5.5 (ref 5.0–8.0)
PROTEIN: NEGATIVE mg/dL
Specific Gravity, Urine: 1.023 (ref 1.005–1.030)

## 2016-06-28 LAB — PROTIME-INR
INR: 1.2
Prothrombin Time: 15.3 seconds — ABNORMAL HIGH (ref 11.4–15.2)

## 2016-06-28 LAB — TSH: TSH: 5.983 u[IU]/mL — AB (ref 0.350–4.500)

## 2016-06-28 LAB — I-STAT ARTERIAL BLOOD GAS, ED
ACID-BASE EXCESS: 2 mmol/L (ref 0.0–2.0)
Bicarbonate: 25.6 mEq/L — ABNORMAL HIGH (ref 20.0–24.0)
O2 Saturation: 91 %
PH ART: 7.478 — AB (ref 7.350–7.450)
TCO2: 27 mmol/L (ref 0–100)
pCO2 arterial: 34.5 mmHg — ABNORMAL LOW (ref 35.0–45.0)
pO2, Arterial: 56 mmHg — ABNORMAL LOW (ref 80.0–100.0)

## 2016-06-28 LAB — IRON AND TIBC
Iron: 32 ug/dL (ref 28–170)
SATURATION RATIOS: 8 % — AB (ref 10.4–31.8)
TIBC: 402 ug/dL (ref 250–450)
UIBC: 370 ug/dL

## 2016-06-28 LAB — FERRITIN: FERRITIN: 55 ng/mL (ref 11–307)

## 2016-06-28 LAB — BRAIN NATRIURETIC PEPTIDE: B NATRIURETIC PEPTIDE 5: 885.7 pg/mL — AB (ref 0.0–100.0)

## 2016-06-28 LAB — RETICULOCYTES
RBC.: 3.79 MIL/uL — ABNORMAL LOW (ref 3.87–5.11)
RETIC CT PCT: 0.8 % (ref 0.4–3.1)
Retic Count, Absolute: 30.3 10*3/uL (ref 19.0–186.0)

## 2016-06-28 LAB — APTT: aPTT: 42 seconds — ABNORMAL HIGH (ref 24–36)

## 2016-06-28 LAB — VITAMIN B12: Vitamin B-12: 1151 pg/mL — ABNORMAL HIGH (ref 180–914)

## 2016-06-28 MED ORDER — CITALOPRAM HYDROBROMIDE 20 MG PO TABS
40.0000 mg | ORAL_TABLET | Freq: Every morning | ORAL | Status: DC
  Administered 2016-06-29: 40 mg via ORAL
  Filled 2016-06-28: qty 2

## 2016-06-28 MED ORDER — SODIUM CHLORIDE 0.9% FLUSH
3.0000 mL | Freq: Two times a day (BID) | INTRAVENOUS | Status: DC
  Administered 2016-06-28 – 2016-06-29 (×2): 3 mL via INTRAVENOUS

## 2016-06-28 MED ORDER — ENTACAPONE 200 MG PO TABS
200.0000 mg | ORAL_TABLET | Freq: Every day | ORAL | Status: DC
  Administered 2016-06-29 (×3): 200 mg via ORAL
  Filled 2016-06-28 (×4): qty 1

## 2016-06-28 MED ORDER — PREDNISOLONE ACETATE 1 % OP SUSP: 1.0000 [drp] | Freq: Two times a day (BID) | OPHTHALMIC | Status: DC

## 2016-06-28 MED ORDER — PANTOPRAZOLE SODIUM 40 MG PO TBEC
40.0000 mg | DELAYED_RELEASE_TABLET | Freq: Every day | ORAL | Status: DC
  Administered 2016-06-28 – 2016-06-29 (×2): 40 mg via ORAL
  Filled 2016-06-28 (×2): qty 1

## 2016-06-28 MED ORDER — ONDANSETRON HCL 4 MG/2ML IJ SOLN: 4.0000 mg | Freq: Four times a day (QID) | INTRAMUSCULAR | Status: DC | PRN

## 2016-06-28 MED ORDER — ACETAMINOPHEN 325 MG PO TABS
650.0000 mg | ORAL_TABLET | Freq: Four times a day (QID) | ORAL | Status: DC | PRN
Start: 2016-06-28 — End: 2016-06-29

## 2016-06-28 MED ORDER — ROPINIROLE HCL 0.25 MG PO TABS
0.2500 mg | ORAL_TABLET | Freq: Every day | ORAL | Status: DC
  Administered 2016-06-28: 0.25 mg via ORAL
  Filled 2016-06-28 (×2): qty 1

## 2016-06-28 MED ORDER — CARBIDOPA-LEVODOPA-ENTACAPONE 37.5-150-200 MG PO TABS: 1.0000 | ORAL_TABLET | Freq: Every day | ORAL | Status: DC

## 2016-06-28 MED ORDER — IPRATROPIUM-ALBUTEROL 0.5-2.5 (3) MG/3ML IN SOLN: 3.0000 mL | RESPIRATORY_TRACT | Status: DC | PRN

## 2016-06-28 MED ORDER — LATANOPROST 0.005 % OP SOLN
1.0000 [drp] | Freq: Every day | OPHTHALMIC | Status: DC
  Filled 2016-06-28: qty 2.5

## 2016-06-28 MED ORDER — EZETIMIBE 10 MG PO TABS
10.0000 mg | ORAL_TABLET | Freq: Every day | ORAL | Status: DC
  Administered 2016-06-28: 10 mg via ORAL
  Filled 2016-06-28: qty 1

## 2016-06-28 MED ORDER — CARBIDOPA-LEVODOPA 25-100 MG PO TABS
1.5000 | ORAL_TABLET | Freq: Every day | ORAL | Status: DC
  Administered 2016-06-29: 1.5 via ORAL
  Filled 2016-06-28 (×3): qty 2

## 2016-06-28 MED ORDER — ONDANSETRON HCL 4 MG PO TABS: 4.0000 mg | ORAL_TABLET | Freq: Four times a day (QID) | ORAL | Status: DC | PRN

## 2016-06-28 MED ORDER — BRIMONIDINE TARTRATE 0.15 % OP SOLN
1.0000 [drp] | Freq: Every day | OPHTHALMIC | Status: DC
  Administered 2016-06-29: 1 [drp] via OPHTHALMIC
  Filled 2016-06-28: qty 5

## 2016-06-28 MED ORDER — SODIUM CHLORIDE 0.9 % IV BOLUS (SEPSIS): 1000.0000 mL | Freq: Once | INTRAVENOUS | Status: DC

## 2016-06-28 MED ORDER — ACETAMINOPHEN 650 MG RE SUPP: 650.0000 mg | Freq: Four times a day (QID) | RECTAL | Status: DC | PRN

## 2016-06-28 MED ORDER — HYDROCODONE-ACETAMINOPHEN 5-325 MG PO TABS: 1.0000 | ORAL_TABLET | Freq: Four times a day (QID) | ORAL | Status: DC | PRN

## 2016-06-28 NOTE — ED Provider Notes (Signed)
[facility] DEPT Provider Note   CSN: [id number] Arrival date & time: 06/28/16  1607     History   Chief Complaint Chief Complaint  Patient presents with  . Altered Mental Status    HPI Jill Hamilton is a 82 y.o. female.  HPI  Patient presents for AMS.  She reportedly left home about 4 hours ago.  She was found by PD and EMS in her car, listless and not oriented, facing the wrong way in traffic.  EMS found her car to have no AC on, and the patient was warm and diaphoretic, so they gave fluids.  She reportedly mildly improved with mentation during transport.    She has no complaints here.    Past Medical History:  Diagnosis Date  . Anxiety   . Arthritis   . Asthma   . H/O seasonal allergies   . Hyperlipidemia   . Mitral valve prolapse   . Moderate mitral insufficiency   . Osteoporosis   . Parkinson's disease ([place])   . Scoliosis     Patient Active Problem List   Diagnosis Date Noted  . Chronic diastolic CHF (congestive heart failure) ([place]) 06/22/2015  . Mitral valve prolapse   . Severe mitral insufficiency   . Acute diastolic CHF (congestive heart failure) ([place]) 04/06/2015  . Heart murmur 04/06/2015  . Hyperlipidemia 04/06/2015  . Repair large type III Hiatus hernia with Gastropexy Feb 2014 12/31/2012  . Asthma 09/30/2012  . Parkinson disease ([place]) 09/30/2012    Past Surgical History:  Procedure Laterality Date  . ABDOMINAL HYSTERECTOMY    . APPENDECTOMY     with hysterectomy  . BLADDER SURGERY     bladder tacked  . CARPAL TUNNEL RELEASE     bilateral  . CHOLECYSTECTOMY    . ESOPHAGOGASTRODUODENOSCOPY  08/12/12  . HAMMER TOE SURGERY     bilateral  . HIATAL HERNIA REPAIR  12/15/2012   laparascopic repair  . HIATAL HERNIA REPAIR  12/15/2012   Procedure: LAPAROSCOPIC REPAIR OF HIATAL HERNIA;  Surgeon: [name], MD;  Location: [facility];  Service: General;;    OB History    No data available       Home Medications    Prior to Admission  medications   Medication Sig Start Date End Date Taking? Authorizing Provider  albuterol (PROVENTIL HFA;VENTOLIN HFA) 108 (90 BASE) MCG/ACT inhaler Inhale 2 puffs into the lungs every 6 (six) hours as needed. For shortness of breath    Historical Provider, MD  ALPRAZolam (XANAX) 0.5 MG tablet Take 1 tablet (0.5 mg total) by mouth as needed for anxiety. 04/29/16   Star Age, MD  amoxicillin (AMOXIL) 500 MG capsule Take four (4) capsules by mouth as needed one (1) hour prior to dental appointments. 05/03/13   Historical Provider, MD  brimonidine (ALPHAGAN) 0.15 % ophthalmic solution Place 1 drop into both eyes daily. 03/13/15   Historical Provider, MD  carbidopa-levodopa-entacapone (STALEVO) 37.5-150-200 MG tablet TAKE ONE TABLET FIVE TIMES A DAY. 7AM, 10AM, 1PM, AT 4 PM, AND 7PM 04/29/16   Star Age, MD  citalopram (CELEXA) 40 MG tablet TAKE ONE TABLET BY MOUTH EVERY MORNING 11/27/15   Star Age, MD  ezetimibe (ZETIA) 10 MG tablet Take 10 mg by mouth at bedtime.     Historical Provider, MD  fluticasone (FLONASE) 50 MCG/ACT nasal spray Place 1 spray into both nostrils daily as needed for allergies or rhinitis.  11/30/14   Historical Provider, MD  furosemide (LASIX) 40 MG tablet Take  1 tablet (40 mg total) by mouth daily. 04/13/15   Peter M Martinique, MD  HYDROcodone-acetaminophen (NORCO/VICODIN) 5-325 MG per tablet Take 1 tablet by mouth every 6 (six) hours as needed. For pain    Historical Provider, MD  omeprazole (PRILOSEC) 40 MG capsule Take 1 capsule (40 mg total) by mouth every morning. 01/26/13   Johnathan Hausen, MD  potassium chloride (K-DUR,KLOR-CON) 10 MEQ tablet Take 10 mEq by mouth daily.    Historical Provider, MD  prednisoLONE acetate (PRED FORTE) 1 % ophthalmic suspension Place 1 drop into both eyes 2 (two) times daily.  05/02/13   Historical Provider, MD  PREVIDENT 5000 DRY MOUTH 1.1 % GEL dental gel Take 1 mL by mouth See admin instructions. 03/27/15   Historical Provider, MD  rOPINIRole (REQUIP)  0.25 MG tablet Take 0.25 mg by mouth at bedtime. Prn take 1 tab daily at bedtime 08/07/15   Historical Provider, MD  traMADol (ULTRAM) 50 MG tablet Take 1 tablet by mouth as needed for moderate pain.  04/28/13   Historical Provider, MD  TRAVATAN Z 0.004 % SOLN ophthalmic solution Place 1 drop into both eyes daily.  07/19/14   Historical Provider, MD    Family History Family History  Problem Relation Age of Onset  . Heart disease Father     Social History Social History  Substance Use Topics  . Smoking status: Never Smoker  . Smokeless tobacco: Never Used  . Alcohol use No     Allergies   Iodine and Iohexol   Review of Systems Review of Systems  Unable to perform ROS: Mental status change     Physical Exam Updated Vital Signs Pulse 99   Temp 100.1 F (37.8 C) (Oral)   Resp 16   SpO2 91%   Physical Exam  Constitutional: She is oriented to person, place, and time. She appears well-developed and well-nourished. She appears listless. No distress.  HENT:  Head: Normocephalic and atraumatic.  Dry MM  Eyes: Conjunctivae are normal.  Neck: Neck supple.  Cardiovascular: Normal rate and regular rhythm.   No murmur heard. Pulmonary/Chest: Effort normal and breath sounds normal. No respiratory distress.  Abdominal: Soft. There is no tenderness.  Musculoskeletal: She exhibits no edema.  Neurological: She is oriented to person, place, and time. She appears listless. GCS eye subscore is 4. GCS verbal subscore is 5. GCS motor subscore is 6.  Some difficulty with patient cooperation Alert and oriented CN II-XII grossly intact Eyes: PERRL, EOMI Bilateral UE strength 4/5 and symmetric Bilateral LE strength 3/5 and symmetric   Skin: Skin is warm and dry.  Psychiatric: She has a normal mood and affect.  Nursing note and vitals reviewed.    ED Treatments / Results  Labs (all labs ordered are listed, but only abnormal results are displayed) Labs Reviewed - No data to  display  EKG  EKG Interpretation None       Radiology No results found.  Procedures Procedures (including critical care time)  Medications Ordered in ED Medications - No data to display   Initial Impression / Assessment and Plan / ED Course  I have reviewed the triage vital signs and the nursing notes.  Pertinent labs & imaging results that were available during my care of the patient were reviewed by me and considered in my medical decision making (see chart for details).  Clinical Course    Patient HDS here.  Waxing and waning mental status.  Labs, imaging ordered.  Labs show AKI (so  fluids given), minimal elevated of trop (EKG with NSR, non-specific T wave changes),  ABG unremarkable, negative lactate, negative Ua.  Neuro exam is non-focal.  No fever or meningismus.    Patient was admitted to hospitalist for further AMS workup.  Final Clinical Impressions(s) / ED Diagnoses   Final diagnoses:  None    New Prescriptions New Prescriptions   No medications on file     Levada Schilling, MD 06/29/16 [id number]    Merrily Pew, MD 06/30/16 1148

## 2016-06-28 NOTE — ED Notes (Signed)
Pt. Husband at bedside. States he will call with information regarding pt. Medication list later tonight.

## 2016-06-28 NOTE — ED Provider Notes (Signed)
I saw and evaluated the patient, reviewed the resident's note and I agree with the findings and plan.  82 yo F w/ AMS of unknown etiology. Patient is alert but oriented to self and place only. Lungs with crackles. Heart with borderline tachycardia and blowing heart murmur. cbg with ems normal.  Did fall yesterday, will workup head bleed.  Some LVH/repol changes on ECG, will workup cardiac.  Pneumonia v chf on lungs, no LE edema but will work up that as well.  Either way, if not significantly improved (or husband states this is her baseline) then will need admission.    EKG Interpretation  Date/Time:  Friday June 28 2016 16:17:15 EDT Ventricular Rate:  96 PR Interval:    QRS Duration: 109 QT Interval:  369 QTC Calculation: 467 R Axis:   -47 Text Interpretation:  Fast sinus arrhythmia LVH with secondary repolarization abnormality Anterior infarct, old LVH new, otherwise there is no significant changes Confirmed by [facility] MD, [place] [phone]) on 06/28/2016 4:46:01 PM         Merrily Pew, MD 06/30/16 1148

## 2016-06-28 NOTE — ED Notes (Signed)
Attempted report 

## 2016-06-28 NOTE — H&P (Signed)
History and Physical    Jill Hamilton [id number] DOB: 1934-07-01 DOA: 06/28/2016  PCP: [name], MD   Patient coming from: Home  Chief Complaint: Altered mental status  HPI: Jill Hamilton is a 82 y.o. woman with a history of Parkinson's disease, asthma, diastolic CHF, and valvular heart disease (severe MR) who was brought to the ED today by EMS personnel after she was found in her car, on the wrong side, of the road, without air conditioning running.  She had altered mental status, was diaphoretic, and was warm to touch.  Unfortunately, she was alone and cannot give many more details about the circumstances surrounding her presentation.  She can remember this morning and reports that she felt like she was in her baseline state of health when she woke up this morning.  She was planning to get her hair done and then go listen to her husband perform (he sings with a group).  She denies any headache, chest pain, shortness of breath, light-headedness, dysuria, blood in her urine or stool, or diarrhea.  No nausea or vomiting.  She left home like normal, but that's essentially all that she can remember at this point.  When asked why she might have been driving without using the air conditioning, she did not have a response.  She says nothing like this has ever happened before.  She denies any recent memory deficits, forgetting directions, getting lost while traveling to familiar locations, or difficulty with performing routine day-to-day activities.  Currently, she is oriented to person, place, location, city, and year.  She could not tell me the month.  She is able to give her date of birth.  ED Course: Temperature of 100.1 noted.  ABG shows pH 7.47, pCO2 34, pO2 56.  BUN 29.  Creatinine 1.16.  Troponin 0.03.  Hgb 9.9, which appears to be decreased from her baseline.  EKG shows sinus arrhythmia with repolarization abnormality.  Chest xray concerning for vascular congestion and pulmonary  edema.  CT scan of her head is negative for acute findings that would explain her symptoms.  U/A is negative for infection.  Hospitalist asked to admit.    Review of Systems: As per HPI otherwise 10 point review of systems negative.    Past Medical History:  Diagnosis Date  . Anxiety   . Arthritis   . Asthma   . H/O seasonal allergies   . Hyperlipidemia   . Mitral valve prolapse   . Moderate mitral insufficiency   . Osteoporosis   . Parkinson's disease ([place])   . Scoliosis     Past Surgical History:  Procedure Laterality Date  . ABDOMINAL HYSTERECTOMY    . APPENDECTOMY     with hysterectomy  . BLADDER SURGERY     bladder tacked  . CARPAL TUNNEL RELEASE     bilateral  . CHOLECYSTECTOMY    . ESOPHAGOGASTRODUODENOSCOPY  08/12/12  . HAMMER TOE SURGERY     bilateral  . HIATAL HERNIA REPAIR  12/15/2012   laparascopic repair  . HIATAL HERNIA REPAIR  12/15/2012   Procedure: LAPAROSCOPIC REPAIR OF HIATAL HERNIA;  Surgeon: [name], MD;  Location: [facility];  Service: General;;     reports that she has never smoked. She has never used smokeless tobacco. She reports that she does not drink alcohol or use drugs.  She is married.   Allergies  Allergen Reactions  . Iodine Hives  . Iohexol Hives     Code: HIVES, Desc: reaction after  a heart cath per pt--needs 13 hour prep, Onset Date: [id number]     Family History  Problem Relation Age of Onset  . Heart disease Father      Prior to Admission medications   Medication Sig Start Date End Date Taking? Authorizing Provider  albuterol (PROVENTIL HFA;VENTOLIN HFA) 108 (90 BASE) MCG/ACT inhaler Inhale 2 puffs into the lungs every 6 (six) hours as needed. For shortness of breath   Yes Historical Provider, MD  ALPRAZolam (XANAX) 0.5 MG tablet Take 1 tablet (0.5 mg total) by mouth as needed for anxiety. Patient taking differently: Take 0.5 mg by mouth daily as needed for anxiety.  04/29/16  Yes Star Age, MD  amoxicillin (AMOXIL) 500  MG capsule Take four (4) capsules by mouth as needed one (1) hour prior to dental appointments. 05/03/13  Yes Historical Provider, MD  brimonidine (ALPHAGAN) 0.15 % ophthalmic solution Place 1 drop into both eyes daily. 03/13/15  Yes Historical Provider, MD  carbidopa-levodopa-entacapone (STALEVO) 37.5-150-200 MG tablet TAKE ONE TABLET FIVE TIMES A DAY. 7AM, 10AM, 1PM, AT 4 PM, AND 7PM 04/29/16  Yes Star Age, MD  Cholecalciferol (VITAMIN D-3 PO) Take 1 capsule by mouth every evening.   Yes Historical Provider, MD  citalopram (CELEXA) 40 MG tablet TAKE ONE TABLET BY MOUTH EVERY MORNING 11/27/15  Yes Star Age, MD  ezetimibe (ZETIA) 10 MG tablet Take 10 mg by mouth at bedtime.    Yes Historical Provider, MD  fluticasone (FLONASE) 50 MCG/ACT nasal spray Place 1 spray into both nostrils daily as needed for allergies or rhinitis.  11/30/14  Yes Historical Provider, MD  furosemide (LASIX) 40 MG tablet Take 1 tablet (40 mg total) by mouth daily. 04/13/15  Yes Peter M Martinique, MD  MAGNESIUM PO Take 1 tablet by mouth every evening.   Yes Historical Provider, MD  oxyCODONE (OXY IR/ROXICODONE) 5 MG immediate release tablet Take 2.5-5 mg by mouth daily. 06/27/16  Yes Historical Provider, MD  potassium chloride SA (K-DUR,KLOR-CON) 20 MEQ tablet Take 20 mEq by mouth daily. 06/19/16  Yes Historical Provider, MD  PREVIDENT 5000 DRY MOUTH 1.1 % GEL dental gel See admin instructions. Use as directed 03/27/15  Yes Historical Provider, MD  Pyridoxine HCl (VITAMIN B-6 PO) Take 1 tablet by mouth every evening.   Yes Historical Provider, MD  traMADol (ULTRAM) 50 MG tablet Take 1 tablet by mouth daily as needed for moderate pain.  04/28/13  Yes Historical Provider, MD  TRAVATAN Z 0.004 % SOLN ophthalmic solution Place 1 drop into both eyes daily.  07/19/14  Yes Historical Provider, MD  vitamin C (ASCORBIC ACID) 500 MG tablet Take 500 mg by mouth every evening.   Yes Historical Provider, MD  rOPINIRole (REQUIP) 0.25 MG tablet Take 0.25  mg by mouth at bedtime as needed (for sleep).  08/07/15   Historical Provider, MD    Physical Exam: Vitals:   06/28/16 1900 06/28/16 1930 06/28/16 2000 06/28/16 2026  BP: 134/74 137/83  130/78  Pulse: 92 93 89 93  Resp: 13 17 13 16   Temp:   98.3 F (36.8 C) 98.1 F (36.7 C)  TempSrc:    Oral  SpO2: 96% 96% 92% 93%  Weight:    42.4 kg (93 lb 6.4 oz)  Height:    5\' 2"  (1.575 m)      Constitutional: NAD, calm, comfortable, nontoxic appearing for me Vitals:   06/28/16 1900 06/28/16 1930 06/28/16 2000 06/28/16 2026  BP: 134/74 137/83  130/78  Pulse: 92  93 89 93  Resp: 13 17 13 16   Temp:   98.3 F (36.8 C) 98.1 F (36.7 C)  TempSrc:    Oral  SpO2: 96% 96% 92% 93%  Weight:    42.4 kg (93 lb 6.4 oz)  Height:    5\' 2"  (1.575 m)   Eyes: PERRL, lids and conjunctivae normal ENMT: Mucous membranes are very dry. Posterior pharynx clear of any exudate or lesions. Poor dentition.  Neck: normal appearance, supple, no masses Respiratory: clear to auscultation bilaterally, no wheezing, no crackles. Normal respiratory effort. No accessory muscle use.  Cardiovascular: Normal rate, regular rhythm, + murmur, Asymmetric lower extremity edema, right greater than left, 1-2+ pitting.  2+ pedal pulses. No carotid bruits.  GI: abdomen is soft and compressible.  No distention.  No tenderness.  No masses palpated.  Bowel sounds are present. Musculoskeletal:  + joint deformities in hands and feet.  ROM preserved in arms and legs; No contractures. Normal muscle tone.  Skin: no rashes, warm and dry Neurologic: CN 2-12 grossly intact. Sensation intact, Strength symmetric bilaterally, 5/5  Psychiatric: Normal judgment and insight. Alert and oriented x 3. Normal mood.     Labs on Admission: I have personally reviewed following labs and imaging studies  CBC:  Recent Labs Lab 06/28/16 1622  WBC 5.6  NEUTROABS 3.7  HGB 9.9*  HCT 32.5*  MCV 90.3  PLT [id number]   Basic Metabolic Panel:  Recent  Labs Lab 06/28/16 1622  NA 139  K 4.3  CL 106  CO2 24  GLUCOSE 77  BUN 29*  CREATININE 1.16*  CALCIUM 8.9   GFR: Estimated Creatinine Clearance: 26.3 mL/min (by C-G formula based on SCr of 1.16 mg/dL). Liver Function Tests:  Recent Labs Lab 06/28/16 1622  AST 36  ALT 6*  ALKPHOS 89  BILITOT 0.8  PROT 6.4*  ALBUMIN 3.1*   Coagulation Profile:  Recent Labs Lab 06/28/16 2141  INR 1.20   Cardiac Enzymes:  Recent Labs Lab 06/28/16 1622 06/28/16 2141  TROPONINI 0.03* 0.04*   BNP (last 3 results) 885  Recent Labs  06/28/16 2141  RETICCTPCT 0.8   Urine analysis:    Component Value Date/Time   COLORURINE YELLOW 06/28/2016 1815   APPEARANCEUR CLEAR 06/28/2016 1815   LABSPEC 1.023 06/28/2016 1815   PHURINE 5.5 06/28/2016 1815   GLUCOSEU NEGATIVE 06/28/2016 1815   HGBUR NEGATIVE 06/28/2016 1815   BILIRUBINUR NEGATIVE 06/28/2016 1815   KETONESUR 15 (A) 06/28/2016 1815   PROTEINUR NEGATIVE 06/28/2016 1815   NITRITE NEGATIVE 06/28/2016 1815   LEUKOCYTESUR NEGATIVE 06/28/2016 1815   Sepsis Labs:  Lactic acid level 1.17  Radiological Exams on Admission: Ct Head Wo Contrast  Result Date: 06/28/2016 CLINICAL DATA:  Altered mental status EXAM: CT HEAD WITHOUT CONTRAST TECHNIQUE: Contiguous axial images were obtained from the base of the skull through the vertex without intravenous contrast. COMPARISON:  06/15/2016 FINDINGS: Brain: No evidence of acute infarction, hemorrhage, hydrocephalus, extra-axial collection or mass lesion/mass effect. Mild small vessel ischemic changes. Mild cortical atrophy.  No ventriculomegaly. Vascular: No hyperdense vessel or unexpected calcification. Skull: No evidence of calvarial fracture. Sinuses/Orbits: No acute finding. Other: None. IMPRESSION: No evidence of acute intracranial abnormality. Atrophy with mild small vessel ischemic changes. Electronically Signed   By: Julian Hy M.D.   On: 06/28/2016 17:26   Dg Chest Portable 1  View  Result Date: 06/28/2016 CLINICAL DATA:  Altered mental status EXAM: PORTABLE CHEST 1 VIEW COMPARISON:  05/06/2016 FINDINGS: Cardiomegaly with vascular  congestion. Diffuse bilateral interstitial and alveolar opacities, most likely edema. Suspect small effusions. No acute bony abnormality. IMPRESSION: Suspect mild to moderate CHF. Electronically Signed   By: Rolm Baptise M.D.   On: 06/28/2016 16:46    EKG: Independently reviewed. Sinus arrhythmia.  No acute ST elevation.  LVH with repolarization abnormality.  Assessment/Plan Principal Problem:   Altered mental state Active Problems:   Parkinson disease (HCC)   Heart murmur   Severe mitral insufficiency   Chronic diastolic CHF (congestive heart failure) (HCC)   Heat exhaustion   Edema   Anemia   Dehydration   Altered mental status      Altered mental status, improved.  I suspect the patient had a mild component of heat exhaustion upon initial presentation, which certainly could have been triggered by driving around in a hot car without air conditioning with the windows up.  However, the cause for this lapse in judgment remains unclear.  She appears to be returning to baseline and denies focal complaints. --Telemetry monitoring --Check orthostatic vital signs --Serial troponin --TSH --She does not appear to be septic or infected at this time --Can consider neurology consult if she relapses  Acute exacerbation of diastolic heart failure --Patient actually received IV fluids as a part of her initial resuscitation but BNP is elevated, chest xray suggests pulmonary edema, she has lower extremity edema, and she is hypoxic on her ABG.  Will give a dose of IV lasix now and monitor response. --Echo in the AM --Strict I/O --Daily weights --Reassess need for additional doses of IV lasix in the AM  Asymmetric lower extremity edema --RLE ultrasound to rule out DVT  Anemia, appears acute on chronic --Anemia panel --Stool occult blood  test with next BM  DVT prophylaxis: SCDs  Code Status: FULL Family Communication: Patient alone at time of admission in the ED Disposition Plan: To be determined Consults called: NONE Admission status: Observation, telemetry   TIME SPENT: 70 minutes   Eber Jones MD [facility] Pager [phone]  If 7PM-7AM, please contact night-coverage [email or web address] Password [facility]  06/28/2016, 10:36 PM

## 2016-06-28 NOTE — ED Notes (Signed)
Admitting MD at bedside.

## 2016-06-28 NOTE — ED Notes (Signed)
Attempted report to floor.  

## 2016-06-28 NOTE — ED Triage Notes (Signed)
Ems Found pt is her car facing the wrong direction toward traffic. Per ems pt was in car with no air on ems had to break window to get out of car. On arrival with ems pt was altered, unknown the last time seen normal. Pt is alert and oriented to person and place. Pt is very diaphoretic and warm to touch. Ems gave 500 CC NS

## 2016-06-29 ENCOUNTER — Observation Stay (HOSPITAL_BASED_OUTPATIENT_CLINIC_OR_DEPARTMENT_OTHER): Payer: Medicare Other

## 2016-06-29 ENCOUNTER — Encounter (HOSPITAL_COMMUNITY): Payer: Self-pay | Admitting: *Deleted

## 2016-06-29 DIAGNOSIS — R609 Edema, unspecified: Secondary | ICD-10-CM

## 2016-06-29 DIAGNOSIS — R410 Disorientation, unspecified: Secondary | ICD-10-CM | POA: Diagnosis not present

## 2016-06-29 DIAGNOSIS — I5032 Chronic diastolic (congestive) heart failure: Secondary | ICD-10-CM | POA: Diagnosis not present

## 2016-06-29 DIAGNOSIS — T675XXA Heat exhaustion, unspecified, initial encounter: Secondary | ICD-10-CM | POA: Diagnosis not present

## 2016-06-29 DIAGNOSIS — R011 Cardiac murmur, unspecified: Secondary | ICD-10-CM

## 2016-06-29 DIAGNOSIS — I340 Nonrheumatic mitral (valve) insufficiency: Secondary | ICD-10-CM

## 2016-06-29 DIAGNOSIS — G20 Parkinson's disease: Secondary | ICD-10-CM

## 2016-06-29 DIAGNOSIS — E860 Dehydration: Secondary | ICD-10-CM | POA: Diagnosis not present

## 2016-06-29 DIAGNOSIS — D649 Anemia, unspecified: Secondary | ICD-10-CM | POA: Diagnosis not present

## 2016-06-29 LAB — BASIC METABOLIC PANEL
Anion gap: 8 (ref 5–15)
BUN: 25 mg/dL — AB (ref 6–20)
CO2: 26 mmol/L (ref 22–32)
CREATININE: 1.11 mg/dL — AB (ref 0.44–1.00)
Calcium: 9.1 mg/dL (ref 8.9–10.3)
Chloride: 106 mmol/L (ref 101–111)
GFR calc Af Amer: 53 mL/min — ABNORMAL LOW (ref >60)
GFR, EST NON AFRICAN AMERICAN: 46 mL/min — AB (ref >60)
GLUCOSE: 89 mg/dL (ref 65–99)
POTASSIUM: 4 mmol/L (ref 3.5–5.1)
SODIUM: 140 mmol/L (ref 135–145)

## 2016-06-29 LAB — FOLATE: FOLATE: 43.8 ng/mL (ref >5.9)

## 2016-06-29 LAB — CBC
HCT: 33.7 % — ABNORMAL LOW (ref 36.0–46.0)
Hemoglobin: 10.4 g/dL — ABNORMAL LOW (ref 12.0–15.0)
MCH: 27.9 pg (ref 26.0–34.0)
MCHC: 30.9 g/dL (ref 30.0–36.0)
MCV: 90.3 fL (ref 78.0–100.0)
PLATELETS: 214 10*3/uL (ref 150–400)
RBC: 3.73 MIL/uL — AB (ref 3.87–5.11)
RDW: 15.4 % (ref 11.5–15.5)
WBC: 5.8 10*3/uL (ref 4.0–10.5)

## 2016-06-29 LAB — OCCULT BLOOD X 1 CARD TO LAB, STOOL: FECAL OCCULT BLD: NEGATIVE

## 2016-06-29 LAB — TROPONIN I
TROPONIN I: 0.03 ng/mL — AB (ref <0.03)
Troponin I: 0.03 ng/mL (ref <0.03)

## 2016-06-29 MED ORDER — OXYCODONE HCL 5 MG PO TABS: 2.5000 mg | ORAL_TABLET | Freq: Every day | ORAL | 0 refills | Status: AC | PRN

## 2016-06-29 MED ORDER — FUROSEMIDE 10 MG/ML IJ SOLN
40.0000 mg | Freq: Once | INTRAMUSCULAR | Status: AC
  Administered 2016-06-29: 40 mg via INTRAVENOUS
  Filled 2016-06-29: qty 4

## 2016-06-29 NOTE — Progress Notes (Signed)
PROGRESS NOTE    Jill Hamilton  [id number] DOB: [birth year] DOA: 06/28/2016 PCP: [name], MD    Brief Narrative:  82yo woman with h/o PD, asthma, diastolic CHF, severe MR brought to the ED after found in the car on the wrong side of the road on a hot day without air conditioner running.  She does not remember much of the details, but remembers getting her hair done and travelling to see her husband.  She had no acute symptoms.  She was oriented in the ed and noted to be warm at 100.17F.  CT head was negative for acute findings.  She had a CXR which showed vascular congestion.    Assessment & Plan: Altered mental state - Her confusion resolved with symptomatic treatment in the ED.  On review of her cardiology notes, she has severe MR and has had difficulty with volume overload and weight gain.  Her cardiologist has been trying to get her volume down with diuretics.  I speculate that she may have been dehydrated and over heated causing her confusion, as she is now back to baseline.  Her husband, who was in the room, felt she was back to baseline - Family and I discussed her not driving anymore, which she agreed to.  - Her Cr is at her relative baseline - She has no signs/symptoms of infection - Hgb was mildly lower than baseline, but she had no signs/symptoms of bleeding.  - Likely she is back to baseline.  I advised her to follow up with PCP in the near future.  - She had a mildly elevated TnI which was stable.  She had no symptoms of chest pain.   Parkinson disease - Seems well controlled.  - Anticholinergics could have also contributed to her dehydration - Monitor medications closely outpatient.     Severe mitral insufficiency - Following with cardiology - They are attempting to get better volume control, but she is likely too frail to undergo surgical repair.     Chronic diastolic CHF (congestive heart failure) - See above - Continue home meds, lasix 40mg  daily - No  need for repeat TTE at this time  Anemia - Normocytic - Likely AOCD from CHF and mild CKD - Monitor - No Sx/Symptoms of bleeding  LE edema, asymmetric - LE ultrasound without DVT.  She tells me the asymmetry is chronic.    DVT prophylaxis: SCDs Code Status: Full Family Communication: Husband at bedside Disposition Plan: D/C today   Consultants:   Was going to have PT evaluate, but patient did not desire this before discharge.    Procedures: None  Antimicrobials:   None    Subjective: Patient reports doing well.  She does not have much memory of the episode in the car.  Does not remember shaking, no B/B incontinence.   Objective: Vitals:   06/28/16 2000 06/28/16 2026 06/29/16 0030 06/29/16 0405  BP:  130/78 136/70 114/72  Pulse: 89 93 86 81  Resp: 13 16 17 18   Temp: 98.3 F (36.8 C) 98.1 F (36.7 C) 98.3 F (36.8 C) 98 F (36.7 C)  TempSrc:  Oral Oral Oral  SpO2: 92% 93% 92% 94%  Weight:  93 lb 6.4 oz (42.4 kg)  100 lb (45.4 kg)  Height:  5\' 2"  (1.575 m)      Intake/Output Summary (Last 24 hours) at 06/29/16 0723 Last data filed at 06/29/16 0406  Gross per 24 hour  Intake  240 ml  Output              650 ml  Net             -410 ml   Filed Weights   06/28/16 2026 06/29/16 0405  Weight: 93 lb 6.4 oz (42.4 kg) 100 lb (45.4 kg)    Examination:  General exam: Appears calm and comfortable, frail, elderly Respiratory system: Clear to auscultation. Respiratory effort normal. Cardiovascular system: S1 & S2 heard, RR, NR. Loud systolic murmur best heart at LUSB Gastrointestinal system: Abdomen is scaphoid, soft and nontender. Normal bowel sounds heard. Central nervous system: Alert and oriented. No gross neurological deficits. Extremities: Varicose veins, 1+ edema to right leg> left Skin: No rashes, lesions or ulcers Psychiatry: Judgement and insight appear normal. Mood & affect appropriate.     Data Reviewed:   CBC:  Recent Labs Lab  06/28/16 1622 06/29/16 0312  WBC 5.6 5.8  NEUTROABS 3.7  --   HGB 9.9* 10.4*  HCT 32.5* 33.7*  MCV 90.3 90.3  PLT 219 [id number]   Basic Metabolic Panel:  Recent Labs Lab 06/28/16 1622 06/29/16 0312  NA 139 140  K 4.3 4.0  CL 106 106  CO2 24 26  GLUCOSE 77 89  BUN 29* 25*  CREATININE 1.16* 1.11*  CALCIUM 8.9 9.1   GFR: Estimated Creatinine Clearance: 29.5 mL/min (by C-G formula based on SCr of 1.11 mg/dL). Liver Function Tests:  Recent Labs Lab 06/28/16 1622  AST 36  ALT 6*  ALKPHOS 89  BILITOT 0.8  PROT 6.4*  ALBUMIN 3.1*   No results for input(s): LIPASE, AMYLASE in the last 168 hours. No results for input(s): AMMONIA in the last 168 hours. Coagulation Profile:  Recent Labs Lab 06/28/16 2141  INR 1.20   Cardiac Enzymes:  Recent Labs Lab 06/28/16 1622 06/28/16 2141 06/29/16 0312  TROPONINI 0.03* 0.04* 0.03*   BNP (last 3 results) No results for input(s): PROBNP in the last 8760 hours. HbA1C: No results for input(s): HGBA1C in the last 72 hours. CBG: No results for input(s): GLUCAP in the last 168 hours. Lipid Profile: No results for input(s): CHOL, HDL, LDLCALC, TRIG, CHOLHDL, LDLDIRECT in the last 72 hours. Thyroid Function Tests:  Recent Labs  06/28/16 2141  TSH 5.983*   Anemia Panel:  Recent Labs  06/28/16 2141 06/28/16 2142  VITAMINB12 1,151*  --   FOLATE  --  43.8  FERRITIN 55  --   TIBC 402  --   IRON 32  --   RETICCTPCT 0.8  --    Sepsis Labs:  Recent Labs Lab 06/28/16 1642  LATICACIDVEN 1.17    No results found for this or any previous visit (from the past 240 hour(s)).       Radiology Studies: Ct Head Wo Contrast  Result Date: 06/28/2016 CLINICAL DATA:  Altered mental status EXAM: CT HEAD WITHOUT CONTRAST TECHNIQUE: Contiguous axial images were obtained from the base of the skull through the vertex without intravenous contrast. COMPARISON:  06/15/2016 FINDINGS: Brain: No evidence of acute infarction, hemorrhage,  hydrocephalus, extra-axial collection or mass lesion/mass effect. Mild small vessel ischemic changes. Mild cortical atrophy.  No ventriculomegaly. Vascular: No hyperdense vessel or unexpected calcification. Skull: No evidence of calvarial fracture. Sinuses/Orbits: No acute finding. Other: None. IMPRESSION: No evidence of acute intracranial abnormality. Atrophy with mild small vessel ischemic changes. Electronically Signed   By: Julian Hy M.D.   On: 06/28/2016 17:26   Dg Chest Portable 1 View  Result Date: 06/28/2016 CLINICAL DATA:  Altered mental status EXAM: PORTABLE CHEST 1 VIEW COMPARISON:  05/06/2016 FINDINGS: Cardiomegaly with vascular congestion. Diffuse bilateral interstitial and alveolar opacities, most likely edema. Suspect small effusions. No acute bony abnormality. IMPRESSION: Suspect mild to moderate CHF. Electronically Signed   By: Rolm Baptise M.D.   On: 06/28/2016 16:46        Scheduled Meds: . brimonidine  1 drop Both Eyes Daily  . carbidopa-levodopa  1.5 tablet Oral 5 X Daily   Or  . entacapone  200 mg Oral 5 X Daily  . citalopram  40 mg Oral q morning - 10a  . ezetimibe  10 mg Oral QHS  . latanoprost  1 drop Both Eyes QHS  . pantoprazole  40 mg Oral Daily  . rOPINIRole  0.25 mg Oral QHS  . sodium chloride  1,000 mL Intravenous Once  . sodium chloride flush  3 mL Intravenous Q12H   Continuous Infusions:    LOS: 0 days    Time spent: 35 minutes    Gilles Chiquito, MD [facility] Pager [phone]  If 7PM-7AM, please contact night-coverage [email or web address] Password [email or web address] 06/29/2016, 7:23 AM

## 2016-06-29 NOTE — Discharge Summary (Signed)
Physician Discharge Summary  Jill Hamilton [id number] DOB: [birth year] DOA: 06/28/2016  PCP: [name], MD  Admit date: 06/28/2016 Discharge date: 06/29/2016  Admitted From: Home  Disposition:  Home  Recommendations for Outpatient Follow-up:  1. Follow up with PCP in 1-2 weeks 2. Please obtain BMP/CBC/TSH in one week 3. Please follow up on the following pending results: None  Home Health:No Equipment/Devices:No  Discharge Condition:Stable CODE STATUS:Full Diet recommendation: Heart Healthy   Brief/Interim Summary: 82yo woman with h/o PD, asthma, diastolic CHF, severe MR brought to the ED after found in the car on the wrong side of the road on a hot day without air conditioner running.  She does not remember much of the details, but remembers getting her hair done and travelling to see her husband.  She had no acute symptoms.  She was oriented in the ed and noted to be warm at 100.34F.  CT head was negative for acute findings.  She had a CXR which showed vascular congestion.  82yo woman with h/o PD, asthma, diastolic CHF, severe MR brought to the ED after found in the car on the wrong side of the road on a hot day without air conditioner running.  She does not remember much of the details, but remembers getting her hair done and travelling to see her husband.  She had no acute symptoms.  She was oriented in the ed and noted to be warm at 100.34F.  CT head was negative for acute findings.  She had a CXR which showed vascular congestion.    Discharge Diagnoses:  Altered mental state, heat exhaustion, dehydration Jill Hamilton was found by EMS sitting on the wrong side of the road in her car without the air conditioning on.  She was confused at the time.  On exam, she did not remember much of the event. By the time she was evaluated by the admitting team, her confusion had resolved and she was alert and oriented.  She has not had episodes like this before and prior to the event was in  her normal state of health.  She had been to get her hair done and was on the way to see her husband.  Initial labs showed a mild AKI with an elevated BUN to 29, Cr of 1.16.  Vitamin B12 was high.  Folate normal.  BNP was 885.7.  ABG showed mildly low O2 - 56.  She had a temperature of 100.1.  CT head was negative in the ED.  She did not receive fluids given her CHF and CXR showing pulmonary vascular congestion.  Over the course of the hospitalization, she did not have another fever.  She had no signs/symptoms of an infection.  UA was nitrite negative, LE negative and no white cells.  CXR as above.  She was back to her baseline mental status on day of discharge, which her husband corroborated.  She has a lot of risk factors for dehydration and heat exhaustion including age, CHF on diuretics and anitcholinergics.  Her symptoms were likely multifactorial and quickly resolved.  Husband reported that they were going to have her cease driving at this time.    Parkinson disease At her baseline.  She was continues on stalevo, ropinirole.    Severe mitral insufficiency Reviewed last Cardiology note by Dr. Martinique and noted that she is not a surgical candidate and that she has had difficult time getting volume under control.  Medical therapy only.  No acute issues while in hospital.  Chronic diastolic CHF (congestive heart failure) Volume control has been challenging in the outpatient setting.  Fluids were held on admission and her home lasix was started.   LE Edema She had right > left LE edema.  She noted that this was chronic for her.  LE doppler on the right did not show any DVT.     Normocytic Anemia Anemia panel showed normal ferritin, low saturation, low normal iron.  Would repeat in the outpatient setting and supplement with iron if needed.   Elevated TSH Mildly high at 5.9.  Suggest repeat in the outpatient setting.   Other home medications were continued at discharge.      Discharge  Instructions  Discharge Instructions    (HEART FAILURE PATIENTS) Call MD:  Anytime you have any of the following symptoms: 1) 3 pound weight gain in 24 hours or 5 pounds in 1 week 2) shortness of breath, with or without a dry hacking cough 3) swelling in the hands, feet or stomach 4) if you have to sleep on extra pillows at night in order to breathe.    Complete by:  As directed   Call MD for:  difficulty breathing, headache or visual disturbances    Complete by:  As directed   Call MD for:  extreme fatigue    Complete by:  As directed   Call MD for:  persistant dizziness or light-headedness    Complete by:  As directed   Call MD for:  persistant nausea and vomiting    Complete by:  As directed   Call MD for:  severe uncontrolled pain    Complete by:  As directed   Call MD for:  temperature >100.4    Complete by:  As directed   Diet - low sodium heart healthy    Complete by:  As directed   Discharge instructions    Complete by:  As directed   Jill Hamilton - -   During your admission, you were evaluated for the reason you were confused.  It appears that you may have been confused due to the heat and not having air conditioning in your car.  Your husband, you and I talked about ceasing driving and I agree that it would be the safest given the many medications you are on.    You were checked for a clot in your leg and you do not have one.    Please follow up with your Cardiologist in 1-2 months to discuss your lasix.  Please follow up with your primary doctor in 1-2 weeks to have your labs checked and discuss your medications.    Get CBC, CMP checked  by Primary MD  in 5-7 days ( we routinely change or add medications that can affect your baseline labs and fluid status, therefore we recommend that you get the mentioned basic workup next visit with your PCP, your PCP may decide not to get them or add new tests based on their clinical decision)   Activity: As tolerated with Full fall precautions  use walker/cane & assistance as needed   Disposition Home   Diet:   Heart Healthy  For Heart failure patients - Check your Weight same time everyday, if you gain over 2 pounds, or you develop in leg swelling, experience more shortness of breath or chest pain, call your Primary MD immediately. Follow Cardiac Low Salt Diet and 1.5 lit/day fluid restriction.   On your next visit with your primary care physician please Get Medicines  reviewed and adjusted.    If you experience worsening of your admission symptoms, develop shortness of breath, life threatening emergency, suicidal or homicidal thoughts you must seek medical attention immediately by calling 911 or calling your MD immediately  if symptoms less severe.  You Must read complete instructions/literature along with all the possible adverse reactions/side effects for all the Medicines you take and that have been prescribed to you. Take any new Medicines after you have completely understood and accpet all the possible adverse reactions/side effects.    Do not take more than prescribed Pain, Sleep and Anxiety Medications   Wear Seat belts while in car.   Please note  You were cared for by a hospitalist during your hospital stay. If you have any questions about your discharge medications or the care you received while you were in the hospital after you are discharged, you can call the unit and asked to speak with the hospitalist on call if the hospitalist that took care of you is not available. Once you are discharged, your primary care physician will handle any further medical issues. Please note that NO REFILLS for any discharge medications will be authorized once you are discharged, as it is imperative that you return to your primary care physician (or establish a relationship with a primary care physician if you do not have one) for your aftercare needs so that they can reassess your need for medications and monitor your lab values.    Increase activity slowly    Complete by:  As directed       Medication List    STOP taking these medications   amoxicillin 500 MG capsule Commonly known as:  AMOXIL     TAKE these medications   albuterol 108 (90 Base) MCG/ACT inhaler Commonly known as:  PROVENTIL HFA;VENTOLIN HFA Inhale 2 puffs into the lungs every 6 (six) hours as needed. For shortness of breath   ALPRAZolam 0.5 MG tablet Commonly known as:  XANAX Take 1 tablet (0.5 mg total) by mouth as needed for anxiety. What changed:  when to take this   brimonidine 0.15 % ophthalmic solution Commonly known as:  ALPHAGAN Place 1 drop into both eyes daily.   carbidopa-levodopa-entacapone 37.5-150-200 MG tablet Commonly known as:  STALEVO TAKE ONE TABLET FIVE TIMES A DAY. 7AM, 10AM, 1PM, AT 4 PM, AND 7PM   citalopram 40 MG tablet Commonly known as:  CELEXA TAKE ONE TABLET BY MOUTH EVERY MORNING   ezetimibe 10 MG tablet Commonly known as:  ZETIA Take 10 mg by mouth at bedtime.   fluticasone 50 MCG/ACT nasal spray Commonly known as:  FLONASE Place 1 spray into both nostrils daily as needed for allergies or rhinitis.   furosemide 40 MG tablet Commonly known as:  LASIX Take 1 tablet (40 mg total) by mouth daily.   MAGNESIUM PO Take 1 tablet by mouth every evening.   oxyCODONE 5 MG immediate release tablet Commonly known as:  Oxy IR/ROXICODONE Take 2.5-5 mg by mouth daily.   potassium chloride SA 20 MEQ tablet Commonly known as:  K-DUR,KLOR-CON Take 20 mEq by mouth daily.   PREVIDENT 5000 DRY MOUTH 1.1 % Gel dental gel Generic drug:  sodium fluoride See admin instructions. Use as directed   rOPINIRole 0.25 MG tablet Commonly known as:  REQUIP Take 0.25 mg by mouth at bedtime as needed (for sleep).   traMADol 50 MG tablet Commonly known as:  ULTRAM Take 1 tablet by mouth daily as needed for moderate pain.  TRAVATAN Z 0.004 % Soln ophthalmic solution Generic drug:  Travoprost (BAK Free) Place 1 drop  into both eyes daily.   VITAMIN B-6 PO Take 1 tablet by mouth every evening.   vitamin C 500 MG tablet Commonly known as:  ASCORBIC ACID Take 500 mg by mouth every evening.   VITAMIN D-3 PO Take 1 capsule by mouth every evening.      Follow-up Information    [name], MD. Schedule an appointment as soon as possible for a visit in 1 week(s).   Specialty:  Internal Medicine Why:  Follow up in 7-14 days Contact information: [address] [place] [address] [phone]        Peter Martinique, MD. Schedule an appointment as soon as possible for a visit today.   Specialty:  Cardiology Why:  In 1-2 months Contact information: [address] [place] [address] [phone]          Allergies  Allergen Reactions  . Iodine Hives  . Iohexol Hives     Code: HIVES, Desc: reaction after a heart cath per pt--needs 13 hour prep, Onset Date: [id number]     Consultations:  None   Procedures/Studies: Ct Head Wo Contrast  Result Date: 06/28/2016 CLINICAL DATA:  Altered mental status EXAM: CT HEAD WITHOUT CONTRAST TECHNIQUE: Contiguous axial images were obtained from the base of the skull through the vertex without intravenous contrast. COMPARISON:  06/15/2016 FINDINGS: Brain: No evidence of acute infarction, hemorrhage, hydrocephalus, extra-axial collection or mass lesion/mass effect. Mild small vessel ischemic changes. Mild cortical atrophy.  No ventriculomegaly. Vascular: No hyperdense vessel or unexpected calcification. Skull: No evidence of calvarial fracture. Sinuses/Orbits: No acute finding. Other: None. IMPRESSION: No evidence of acute intracranial abnormality. Atrophy with mild small vessel ischemic changes. Electronically Signed   By: Julian Hy M.D.   On: 06/28/2016 17:26   Dg Chest Portable 1 View  Result Date: 06/28/2016 CLINICAL DATA:  Altered mental status EXAM: PORTABLE CHEST 1 VIEW COMPARISON:  05/06/2016 FINDINGS: Cardiomegaly  with vascular congestion. Diffuse bilateral interstitial and alveolar opacities, most likely edema. Suspect small effusions. No acute bony abnormality. IMPRESSION: Suspect mild to moderate CHF. Electronically Signed   By: Rolm Baptise M.D.   On: 06/28/2016 16:46       Subjective: On day of discharge she was doing well, no complaints.  She felt back to baseline.   Discharge Exam: Vitals:   06/29/16 0405 06/29/16 1233  BP: 114/72 116/68  Pulse: 81 80  Resp: 18 18  Temp: 98 F (36.7 C) 97 F (36.1 C)   Vitals:   06/28/16 2026 06/29/16 0030 06/29/16 0405 06/29/16 1233  BP: 130/78 136/70 114/72 116/68  Pulse: 93 86 81 80  Resp: 16 17 18 18   Temp: 98.1 F (36.7 C) 98.3 F (36.8 C) 98 F (36.7 C) 97 F (36.1 C)  TempSrc: Oral Oral Oral Oral  SpO2: 93% 92% 94% 95%  Weight: 93 lb 6.4 oz (42.4 kg)  100 lb (45.4 kg)   Height: 5\' 2"  (1.575 m)       General exam: Appears calm and comfortable, frail, elderly Respiratory system: Clear to auscultation. Respiratory effort normal. Cardiovascular system: S1 & S2 heard, RR, NR. Loud systolic murmur best heart at LUSB Gastrointestinal system: Abdomen is scaphoid, soft and nontender. Normal bowel sounds heard. Central nervous system: Alert and oriented. No gross neurological deficits. Extremities: Varicose veins, 1+ edema to right leg> left Skin: No rashes, lesions or ulcers Psychiatry: Judgement and  insight appear normal. Mood & affect appropriate.     The results of significant diagnostics from this hospitalization (including imaging, microbiology, ancillary and laboratory) are listed below for reference.     Microbiology: No results found for this or any previous visit (from the past 240 hour(s)).   Labs: BNP (last 3 results)  Recent Labs  06/28/16 2141  BNP [id number]*   Basic Metabolic Panel:  Recent Labs Lab 06/28/16 1622 06/29/16 0312  NA 139 140  K 4.3 4.0  CL 106 106  CO2 24 26  GLUCOSE 77 89  BUN 29* 25*   CREATININE 1.16* 1.11*  CALCIUM 8.9 9.1   Liver Function Tests:  Recent Labs Lab 06/28/16 1622  AST 36  ALT 6*  ALKPHOS 89  BILITOT 0.8  PROT 6.4*  ALBUMIN 3.1*   CBC:  Recent Labs Lab 06/28/16 1622 06/29/16 0312  WBC 5.6 5.8  NEUTROABS 3.7  --   HGB 9.9* 10.4*  HCT 32.5* 33.7*  MCV 90.3 90.3  PLT 219 214   Cardiac Enzymes:  Recent Labs Lab 06/28/16 1622 06/28/16 2141 06/29/16 0312 06/29/16 0846  TROPONINI 0.03* 0.04* 0.03* 0.03*   Thyroid function studies  Recent Labs  06/28/16 2141  TSH 5.983*   Anemia work up  Recent Labs  06/28/16 2141 06/28/16 2142  VITAMINB12 1,151*  --   FOLATE  --  43.8  FERRITIN 55  --   TIBC 402  --   IRON 32  --   RETICCTPCT 0.8  --    Urinalysis    Component Value Date/Time   COLORURINE YELLOW 06/28/2016 1815   APPEARANCEUR CLEAR 06/28/2016 1815   LABSPEC 1.023 06/28/2016 1815   PHURINE 5.5 06/28/2016 1815   GLUCOSEU NEGATIVE 06/28/2016 1815   HGBUR NEGATIVE 06/28/2016 1815   BILIRUBINUR NEGATIVE 06/28/2016 1815   KETONESUR 15 (A) 06/28/2016 1815   PROTEINUR NEGATIVE 06/28/2016 1815   NITRITE NEGATIVE 06/28/2016 1815   LEUKOCYTESUR NEGATIVE 06/28/2016 1815    Time coordinating discharge: Over 35 minutes  SIGNED:   Gilles Chiquito, MD  [facility] 06/29/2016, 2:42 PM Pager   [phone]  If 7PM-7AM, please contact night-coverage [email or web address] Password [email or web address]

## 2016-06-29 NOTE — Progress Notes (Signed)
Patient discharge to home accompanied by NT and patient's spouse via wheelchair. Discharge instructions given. Patient verbalizes understanding. Telemetry box and IV removed prior to discharge and site in good condition. All personal belongings given.

## 2016-06-29 NOTE — Progress Notes (Signed)
VASCULAR LAB PRELIMINARY  PRELIMINARY  PRELIMINARY  PRELIMINARY  Right lower extremity venous duplex completed.    Preliminary report:  Right:  No evidence of DVT, superficial thrombosis, or Baker's cyst.  Marvin Grabill, RVS 06/29/2016, 12:15 PM

## 2016-07-08 DIAGNOSIS — D649 Anemia, unspecified: Secondary | ICD-10-CM | POA: Diagnosis not present

## 2016-07-08 DIAGNOSIS — R946 Abnormal results of thyroid function studies: Secondary | ICD-10-CM | POA: Diagnosis not present

## 2016-07-08 DIAGNOSIS — J3089 Other allergic rhinitis: Secondary | ICD-10-CM | POA: Diagnosis not present

## 2016-07-26 DIAGNOSIS — Z23 Encounter for immunization: Secondary | ICD-10-CM | POA: Diagnosis not present

## 2016-07-28 DIAGNOSIS — E860 Dehydration: Secondary | ICD-10-CM | POA: Diagnosis not present

## 2016-07-28 DIAGNOSIS — D649 Anemia, unspecified: Secondary | ICD-10-CM | POA: Diagnosis not present

## 2016-07-28 DIAGNOSIS — G20 Parkinson's disease: Secondary | ICD-10-CM | POA: Diagnosis not present

## 2016-07-28 DIAGNOSIS — I5032 Chronic diastolic (congestive) heart failure: Secondary | ICD-10-CM | POA: Diagnosis not present

## 2016-07-28 DIAGNOSIS — R4182 Altered mental status, unspecified: Secondary | ICD-10-CM | POA: Diagnosis not present

## 2016-07-28 DIAGNOSIS — I340 Nonrheumatic mitral (valve) insufficiency: Secondary | ICD-10-CM | POA: Diagnosis not present

## 2016-07-28 DIAGNOSIS — T675XXA Heat exhaustion, unspecified, initial encounter: Secondary | ICD-10-CM | POA: Diagnosis not present

## 2016-07-28 DIAGNOSIS — R600 Localized edema: Secondary | ICD-10-CM | POA: Diagnosis not present

## 2016-08-07 DIAGNOSIS — J3089 Other allergic rhinitis: Secondary | ICD-10-CM | POA: Diagnosis not present

## 2016-08-23 DIAGNOSIS — I5032 Chronic diastolic (congestive) heart failure: Secondary | ICD-10-CM | POA: Diagnosis not present

## 2016-08-23 DIAGNOSIS — J3089 Other allergic rhinitis: Secondary | ICD-10-CM | POA: Diagnosis not present

## 2016-08-23 DIAGNOSIS — Z681 Body mass index (BMI) 19 or less, adult: Secondary | ICD-10-CM | POA: Diagnosis not present

## 2016-08-23 DIAGNOSIS — J188 Other pneumonia, unspecified organism: Secondary | ICD-10-CM | POA: Diagnosis not present

## 2016-08-23 DIAGNOSIS — G20 Parkinson's disease: Secondary | ICD-10-CM | POA: Diagnosis not present

## 2016-08-23 DIAGNOSIS — R946 Abnormal results of thyroid function studies: Secondary | ICD-10-CM | POA: Diagnosis not present

## 2016-08-23 DIAGNOSIS — D649 Anemia, unspecified: Secondary | ICD-10-CM | POA: Diagnosis not present

## 2016-08-28 DIAGNOSIS — H401131 Primary open-angle glaucoma, bilateral, mild stage: Secondary | ICD-10-CM | POA: Diagnosis not present

## 2016-09-03 DIAGNOSIS — J3089 Other allergic rhinitis: Secondary | ICD-10-CM | POA: Diagnosis not present

## 2016-09-04 ENCOUNTER — Telehealth: Payer: Self-pay

## 2016-09-04 ENCOUNTER — Ambulatory Visit: Payer: Medicare Other | Admitting: Neurology

## 2016-09-04 NOTE — Telephone Encounter (Signed)
Patient cancelled appt within 24 hours of appt.

## 2016-09-11 ENCOUNTER — Encounter: Payer: Self-pay | Admitting: Neurology

## 2016-09-16 DIAGNOSIS — H401131 Primary open-angle glaucoma, bilateral, mild stage: Secondary | ICD-10-CM | POA: Diagnosis not present

## 2016-09-23 DIAGNOSIS — J3089 Other allergic rhinitis: Secondary | ICD-10-CM | POA: Diagnosis not present

## 2016-09-26 DIAGNOSIS — J3089 Other allergic rhinitis: Secondary | ICD-10-CM | POA: Diagnosis not present

## 2016-09-30 DIAGNOSIS — J3089 Other allergic rhinitis: Secondary | ICD-10-CM | POA: Diagnosis not present

## 2016-10-07 DIAGNOSIS — J3089 Other allergic rhinitis: Secondary | ICD-10-CM | POA: Diagnosis not present

## 2016-10-14 DIAGNOSIS — J3089 Other allergic rhinitis: Secondary | ICD-10-CM | POA: Diagnosis not present

## 2016-10-15 DIAGNOSIS — H401131 Primary open-angle glaucoma, bilateral, mild stage: Secondary | ICD-10-CM | POA: Diagnosis not present

## 2016-10-21 DIAGNOSIS — J3089 Other allergic rhinitis: Secondary | ICD-10-CM | POA: Diagnosis not present

## 2016-10-22 ENCOUNTER — Other Ambulatory Visit: Payer: Self-pay | Admitting: Neurology

## 2016-10-22 DIAGNOSIS — G20 Parkinson's disease: Secondary | ICD-10-CM

## 2016-10-22 DIAGNOSIS — F419 Anxiety disorder, unspecified: Secondary | ICD-10-CM

## 2016-10-28 DIAGNOSIS — J3089 Other allergic rhinitis: Secondary | ICD-10-CM | POA: Diagnosis not present

## 2016-10-29 NOTE — Progress Notes (Addendum)
Jill Hamilton was seen today in the movement disorders clinic for neurologic consultation at the request of [name], MD.  The consultation is for the evaluation of Parkinsons disease.  Pt currently seeing Dr. Rexene Alberts.  Prior to that she saw Dr. Erling Cruz.  The records that were made available to me were reviewed.  While Dr. Guadelupe Sabin records are present, Dr. Bernardo Heater records are not.  This patient is accompanied in the office by her spouse who supplements the history.   Records indicate that the patient was diagnosed with an unknown gait disorder in 2004 and Parkinson's disease in 2006.  She was initially started on levodopa and then ropinirole was later added.  She did develop dyskinesias, for which amantadine was used.  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  At some point in time, she was changed to stalevo and is currently on stalevo 150 mg at 7am/10am/1pm/4pm/7pm (takes with ensure).  She is on requip 0.25 mg q hs.  She was last seen by Dr. Rexene Alberts in 04/2016.  Specific Symptoms:  Tremor: No. Family hx of similar:  No. Voice: gotten weaker per husband, especially after pneumonia in last 6 months Sleep: sleeping well  Vivid Dreams:  No.  Acting out dreams:  rarely Wet Pillows: Yes.   Postural symptoms:  Yes.  , especially after seated for more than 15 min  Falls?  Yes.  , last 9 months ago (no hx of fx; did have stitches in head 2 years ago) Bradykinesia symptoms: slow movements and difficulty getting out of a chair Loss of smell:  No. Loss of taste:  Yes.   Urinary Incontinence:  Yes.  , wears undergarments Difficulty Swallowing:  Some trouble (takes levodopa with ensure) Handwriting, micrographia: Yes.   Trouble with ADL's:  Yes.   (husband assists with dressing - needs help with anything going over head)  Trouble buttoning clothing: Yes.   Depression:  Usually good per husband Memory changes:  Yes.   (usually remembers medication; quit driving about 3 months ago after she  got lost driving and police found her and all windows were up and heat was on instead of air and she was dehydrated; doesn't exercise; husband is having to take care of more of monthly finances) Hallucinations:  No.  visual distortions: No. N/V:  No. Lightheaded:  No.  Syncope: No. Diplopia:  No. Dyskinesia:  No. per pt; yes per records  Neuroimaging has previously been performed.  It is available for my review today.  CT brain done on 06/28/16 was unremarkable and just demonstrated mild wmd.  PREVIOUS MEDICATIONS:  amantadine (developed livedo reticularis)  ALLERGIES:   Allergies  Allergen Reactions  . Iodine Hives  . Iohexol Hives     Code: HIVES, Desc: reaction after a heart cath per pt--needs 13 hour prep, Onset Date: [id number]     CURRENT MEDICATIONS:  Outpatient Encounter Prescriptions as of 10/31/2016  Medication Sig  . albuterol (PROVENTIL HFA;VENTOLIN HFA) 108 (90 BASE) MCG/ACT inhaler Inhale 2 puffs into the lungs every 6 (six) hours as needed. For shortness of breath  . ALPRAZolam (XANAX) 0.5 MG tablet Take 1 tablet (0.5 mg total) by mouth as needed for anxiety. (Patient taking differently: Take 0.5 mg by mouth daily as needed for anxiety. )  . brimonidine (ALPHAGAN) 0.15 % ophthalmic solution Place 1 drop into both eyes daily.  . carbidopa-levodopa-entacapone (STALEVO) 37.5-150-200 MG tablet TAKE ONE TABLET FIVE TIMES A DAY. 7AM, 10AM, 1PM, AT 4 PM, AND 7PM  .  Cholecalciferol (VITAMIN D-3 PO) Take 1 capsule by mouth every evening.  . citalopram (CELEXA) 40 MG tablet TAKE ONE TABLET BY MOUTH EVERY MORNING  . ezetimibe (ZETIA) 10 MG tablet Take 10 mg by mouth at bedtime.   . fluticasone (FLONASE) 50 MCG/ACT nasal spray Place 1 spray into both nostrils daily as needed for allergies or rhinitis.   . furosemide (LASIX) 40 MG tablet Take 1 tablet (40 mg total) by mouth daily.  Marland Kitchen oxyCODONE (OXY IR/ROXICODONE) 5 MG immediate release tablet Take 0.5-1 tablets (2.5-5 mg total) by  mouth daily as needed for severe pain.  . potassium chloride SA (K-DUR,KLOR-CON) 20 MEQ tablet Take 20 mEq by mouth daily.  Marland Kitchen rOPINIRole (REQUIP) 0.25 MG tablet Take 0.25 mg by mouth at bedtime as needed (for sleep).   . TRAVATAN Z 0.004 % SOLN ophthalmic solution Place 1 drop into both eyes daily.   . vitamin C (ASCORBIC ACID) 500 MG tablet Take 500 mg by mouth every evening.  Marland Kitchen PREVIDENT 5000 DRY MOUTH 1.1 % GEL dental gel See admin instructions. Use as directed  . [DISCONTINUED] MAGNESIUM PO Take 1 tablet by mouth every evening.  . [DISCONTINUED] Pyridoxine HCl (VITAMIN B-6 PO) Take 1 tablet by mouth every evening.  . [DISCONTINUED] traMADol (ULTRAM) 50 MG tablet Take 1 tablet by mouth daily as needed for moderate pain.    No facility-administered encounter medications on file as of 10/31/2016.     PAST MEDICAL HISTORY:   Past Medical History:  Diagnosis Date  . Anxiety   . Arthritis   . Asthma   . H/O seasonal allergies   . Hyperlipidemia   . Mitral valve prolapse   . Moderate mitral insufficiency   . Osteoporosis   . Parkinson's disease ([place])   . Scoliosis     PAST SURGICAL HISTORY:   Past Surgical History:  Procedure Laterality Date  . ABDOMINAL HYSTERECTOMY    . APPENDECTOMY     with hysterectomy  . BLADDER SURGERY     bladder tacked  . CARPAL TUNNEL RELEASE     bilateral  . CHOLECYSTECTOMY    . ESOPHAGOGASTRODUODENOSCOPY  08/12/12  . HAMMER TOE SURGERY     bilateral  . HIATAL HERNIA REPAIR  12/15/2012   laparascopic repair  . HIATAL HERNIA REPAIR  12/15/2012   Procedure: LAPAROSCOPIC REPAIR OF HIATAL HERNIA;  Surgeon: [name], MD;  Location: [facility];  Service: General;;    SOCIAL HISTORY:   Social History   Social History  . Marital status: Married    Spouse name: Lurline Hare  . Number of children: 1  . Years of education: 68   Occupational History  . retired     Merchant [employer] officer   Social History Main Topics  . Smoking status: Never Smoker    . Smokeless tobacco: Never Used  . Alcohol use No  . Drug use: No  . Sexual activity: Not on file   Other Topics Concern  . Not on file   Social History Narrative  . No narrative on file    FAMILY HISTORY:   Family Status  Relation Status  . Father Deceased at age 26   heart attack  . Mother Deceased at age 3   cerebral hemmorrhage  . Brother Alive  . Son Alive    ROS:  A complete 10 system review of systems was obtained and was unremarkable apart from what is mentioned above.  PHYSICAL EXAMINATION:    VITALS:   Vitals:  10/31/16 1318  BP: 100/60  Pulse: 76  Weight: 102 lb (46.3 kg)  Height: 5\' 2"  (1.575 m)    GEN:  The patient appears stated age and is in NAD. HEENT:  Normocephalic, atraumatic.  The mucous membranes are dry. The superficial temporal arteries are without ropiness or tenderness. CV:  RRR Lungs:  CTAB Neck/HEME:  There are no carotid bruits bilaterally.  Neurological examination:  Orientation:  Montreal Cognitive Assessment  10/31/2016  Visuospatial/ Executive (0/5) 1  Naming (0/3) 2  Attention: Read list of digits (0/2) 1  Attention: Read list of letters (0/1) 0  Attention: Serial 7 subtraction starting at 100 (0/3) 1  Language: Repeat phrase (0/2) 2  Language : Fluency (0/1) 0  Abstraction (0/2) 1  Delayed Recall (0/5) 1  Orientation (0/6) 5  Total 14  Adjusted Score (based on education) 14   Cranial nerves: There is good facial symmetry. Lips are parted.Pupils are equal round and reactive to light bilaterally. Fundoscopic exam reveals clear margins bilaterally. Extraocular muscles are intact. The visual fields are full to confrontational testing. The speech is fluent and clear. Soft palate rises symmetrically and there is no tongue deviation. Hearing is intact to conversational tone. Sensation: Sensation is intact to light and pinprick throughout (facial, trunk, extremities). Vibration is intact at the bilateral big toe. There is no  extinction with double simultaneous stimulation. There is no sensory dermatomal level identified. Motor: Strength is 5/5 in the bilateral upper and lower extremities.   Grip strength is decreased bilaterally due to arthritis.  Shoulder shrug is equal and symmetric.  There is no pronator drift. Deep tendon reflexes: Deep tendon reflexes are 1/4 at the bilateral biceps, triceps, brachioradialis, patella and achilles. There is a striatal toe on the right.  Plantar response on the L is neutral.    Movement examination: Tone: There is slight increased tone in the RUE but otherwise tone is normal.   Abnormal movements: There is mild dyskinesia in the head and bilateral lower extremities, L more than R Coordination:  There is decremation with RAM's, with finger taps bilaterally, R more than L.  There is decremation with hand opening and closing bilaterally, right more than L.  Significant trouble with toe taps on the right.  Heel taps are good bilaterally.   Gait and Station: The patient has difficulty arising out of a deep-seated chair without the use of the hands but she does push off of the chair and then walks well down the hall.  She has slight pisa syndrome to the right. The patient's stride length is slightly decreased.  She is unstable.  ADDENDUM 01/06/17:  Recent lab work from primary care physician's office dated favor 22nd, 2018.  Sodium was 141, potassium 4.3, chloride 106, CO2 24, BUN 24, creatinine 1.0, AST 34, ALT 7, alkaline phosphatase 111.  Lipitor 6.3, hemoglobin 12.2, hematocrit 40.6 and platelets 191.  Total cholesterol 138, HDL of 50, LDL 77, TSH 3.16  ASSESSMENT/PLAN:  1.  Idiopathic Parkinson's disease.  The patient was diagnosed in 2006 by Dr. Erling Cruz.  -We discussed the diagnosis as well as pathophysiology of the disease.  We discussed treatment options as well as prognostic indicators.  Patient education was provided.  -Greater than 50% of the 60 minute visit was spent in counseling  answering questions and talking about what to expect now as well as in the future.  We talked about medication options.  She is near optimally treated currently.  We talked about safety in  the home.  -She will continue stalevo 150 mg at 7am/10am/1pm/4pm/7pm.  The patient is currently taking her Stalevo with Ensure, and I told her not to do this because of the protein in the Ensure.  I told her she could take it down with ice cream or applesauce instead.  -For now, she will continue the very low dose of Requip, 0.25 mg at night for restless legs.  I do not think this small doses interfering with cognition and she thinks it is very helpful for restless legs.  -I will refer the patient for home physical therapy.  She does not drive.  -We discussed community resources in the area including patient support groups and community exercise programs for PD and pt education was provided to the patient.  She attends [place] support group.  Otherwise, she gets out of the home very little.  2.  Parkinson's dementia  -Overall, the patient has good support in the home.  She is living in an assisted living facility, although fairly independently.  Her husband is taking good care of her currently.  She is not driving.  He is monitoring her medications.   3.  I will plan on seeing her back in follow-up in the next 5 months, sooner should new neurologic issues arise.  Cc:  [name], MD

## 2016-10-31 ENCOUNTER — Encounter: Payer: Self-pay | Admitting: Neurology

## 2016-10-31 ENCOUNTER — Ambulatory Visit (INDEPENDENT_AMBULATORY_CARE_PROVIDER_SITE_OTHER): Payer: Medicare Other | Admitting: Neurology

## 2016-10-31 VITALS — BP 100/60 | HR 76 | Ht 62.0 in | Wt 102.0 lb

## 2016-10-31 DIAGNOSIS — F0280 Dementia in other diseases classified elsewhere without behavioral disturbance: Secondary | ICD-10-CM | POA: Diagnosis not present

## 2016-10-31 DIAGNOSIS — G20 Parkinson's disease: Secondary | ICD-10-CM | POA: Diagnosis not present

## 2016-10-31 DIAGNOSIS — G249 Dystonia, unspecified: Secondary | ICD-10-CM | POA: Diagnosis not present

## 2016-10-31 NOTE — Patient Instructions (Signed)
1. [facility] will call you to set up physical therapy.   2. Take your Stalevo (Levodopa) with ice cream or applesauce - not ensure.

## 2016-11-06 DIAGNOSIS — J3089 Other allergic rhinitis: Secondary | ICD-10-CM | POA: Diagnosis not present

## 2016-11-07 ENCOUNTER — Telehealth: Payer: Self-pay | Admitting: Neurology

## 2016-11-07 NOTE — Telephone Encounter (Signed)
Rob with [facility] called to inform us patient refused start of care this week and moved to next week. [phone].

## 2016-11-13 DIAGNOSIS — J3089 Other allergic rhinitis: Secondary | ICD-10-CM | POA: Diagnosis not present

## 2016-11-19 DIAGNOSIS — J3089 Other allergic rhinitis: Secondary | ICD-10-CM | POA: Diagnosis not present

## 2016-11-20 ENCOUNTER — Telehealth: Payer: Self-pay | Admitting: Neurology

## 2016-11-20 NOTE — Telephone Encounter (Signed)
Jill Hamilton with [facility] called to get start of care orders for PT for 11/21/16. Patient has deferred treatment until now. Eustace Pen at [phone] and gave verbal okay for start of care.

## 2016-11-21 DIAGNOSIS — F0280 Dementia in other diseases classified elsewhere without behavioral disturbance: Secondary | ICD-10-CM | POA: Diagnosis not present

## 2016-11-21 DIAGNOSIS — M1990 Unspecified osteoarthritis, unspecified site: Secondary | ICD-10-CM | POA: Diagnosis not present

## 2016-11-21 DIAGNOSIS — M810 Age-related osteoporosis without current pathological fracture: Secondary | ICD-10-CM | POA: Diagnosis not present

## 2016-11-21 DIAGNOSIS — G20 Parkinson's disease: Secondary | ICD-10-CM | POA: Diagnosis not present

## 2016-11-21 DIAGNOSIS — I341 Nonrheumatic mitral (valve) prolapse: Secondary | ICD-10-CM | POA: Diagnosis not present

## 2016-11-21 DIAGNOSIS — M419 Scoliosis, unspecified: Secondary | ICD-10-CM | POA: Diagnosis not present

## 2016-11-21 DIAGNOSIS — J45909 Unspecified asthma, uncomplicated: Secondary | ICD-10-CM | POA: Diagnosis not present

## 2016-11-21 DIAGNOSIS — F419 Anxiety disorder, unspecified: Secondary | ICD-10-CM | POA: Diagnosis not present

## 2016-11-21 DIAGNOSIS — E785 Hyperlipidemia, unspecified: Secondary | ICD-10-CM | POA: Diagnosis not present

## 2016-11-26 DIAGNOSIS — F0280 Dementia in other diseases classified elsewhere without behavioral disturbance: Secondary | ICD-10-CM | POA: Diagnosis not present

## 2016-11-26 DIAGNOSIS — F419 Anxiety disorder, unspecified: Secondary | ICD-10-CM | POA: Diagnosis not present

## 2016-11-26 DIAGNOSIS — M419 Scoliosis, unspecified: Secondary | ICD-10-CM | POA: Diagnosis not present

## 2016-11-26 DIAGNOSIS — M1990 Unspecified osteoarthritis, unspecified site: Secondary | ICD-10-CM | POA: Diagnosis not present

## 2016-11-26 DIAGNOSIS — J45909 Unspecified asthma, uncomplicated: Secondary | ICD-10-CM | POA: Diagnosis not present

## 2016-11-26 DIAGNOSIS — G20 Parkinson's disease: Secondary | ICD-10-CM | POA: Diagnosis not present

## 2016-11-30 DIAGNOSIS — G20 Parkinson's disease: Secondary | ICD-10-CM | POA: Diagnosis not present

## 2016-11-30 DIAGNOSIS — F0280 Dementia in other diseases classified elsewhere without behavioral disturbance: Secondary | ICD-10-CM | POA: Diagnosis not present

## 2016-11-30 DIAGNOSIS — M1990 Unspecified osteoarthritis, unspecified site: Secondary | ICD-10-CM | POA: Diagnosis not present

## 2016-11-30 DIAGNOSIS — F419 Anxiety disorder, unspecified: Secondary | ICD-10-CM | POA: Diagnosis not present

## 2016-11-30 DIAGNOSIS — M419 Scoliosis, unspecified: Secondary | ICD-10-CM | POA: Diagnosis not present

## 2016-11-30 DIAGNOSIS — J45909 Unspecified asthma, uncomplicated: Secondary | ICD-10-CM | POA: Diagnosis not present

## 2016-12-04 DIAGNOSIS — J4520 Mild intermittent asthma, uncomplicated: Secondary | ICD-10-CM | POA: Diagnosis not present

## 2016-12-04 DIAGNOSIS — J3089 Other allergic rhinitis: Secondary | ICD-10-CM | POA: Diagnosis not present

## 2016-12-04 DIAGNOSIS — H1045 Other chronic allergic conjunctivitis: Secondary | ICD-10-CM | POA: Diagnosis not present

## 2016-12-05 DIAGNOSIS — M1990 Unspecified osteoarthritis, unspecified site: Secondary | ICD-10-CM | POA: Diagnosis not present

## 2016-12-05 DIAGNOSIS — M419 Scoliosis, unspecified: Secondary | ICD-10-CM | POA: Diagnosis not present

## 2016-12-05 DIAGNOSIS — F419 Anxiety disorder, unspecified: Secondary | ICD-10-CM | POA: Diagnosis not present

## 2016-12-05 DIAGNOSIS — J45909 Unspecified asthma, uncomplicated: Secondary | ICD-10-CM | POA: Diagnosis not present

## 2016-12-05 DIAGNOSIS — F0280 Dementia in other diseases classified elsewhere without behavioral disturbance: Secondary | ICD-10-CM | POA: Diagnosis not present

## 2016-12-05 DIAGNOSIS — G20 Parkinson's disease: Secondary | ICD-10-CM | POA: Diagnosis not present

## 2016-12-10 DIAGNOSIS — M1990 Unspecified osteoarthritis, unspecified site: Secondary | ICD-10-CM | POA: Diagnosis not present

## 2016-12-10 DIAGNOSIS — F419 Anxiety disorder, unspecified: Secondary | ICD-10-CM | POA: Diagnosis not present

## 2016-12-10 DIAGNOSIS — J45909 Unspecified asthma, uncomplicated: Secondary | ICD-10-CM | POA: Diagnosis not present

## 2016-12-10 DIAGNOSIS — G20 Parkinson's disease: Secondary | ICD-10-CM | POA: Diagnosis not present

## 2016-12-10 DIAGNOSIS — M419 Scoliosis, unspecified: Secondary | ICD-10-CM | POA: Diagnosis not present

## 2016-12-10 DIAGNOSIS — F0280 Dementia in other diseases classified elsewhere without behavioral disturbance: Secondary | ICD-10-CM | POA: Diagnosis not present

## 2016-12-12 ENCOUNTER — Ambulatory Visit: Payer: Medicare Other | Admitting: Neurology

## 2016-12-12 DIAGNOSIS — J45909 Unspecified asthma, uncomplicated: Secondary | ICD-10-CM | POA: Diagnosis not present

## 2016-12-12 DIAGNOSIS — F419 Anxiety disorder, unspecified: Secondary | ICD-10-CM | POA: Diagnosis not present

## 2016-12-12 DIAGNOSIS — M419 Scoliosis, unspecified: Secondary | ICD-10-CM | POA: Diagnosis not present

## 2016-12-12 DIAGNOSIS — G20 Parkinson's disease: Secondary | ICD-10-CM | POA: Diagnosis not present

## 2016-12-12 DIAGNOSIS — F0280 Dementia in other diseases classified elsewhere without behavioral disturbance: Secondary | ICD-10-CM | POA: Diagnosis not present

## 2016-12-12 DIAGNOSIS — M1990 Unspecified osteoarthritis, unspecified site: Secondary | ICD-10-CM | POA: Diagnosis not present

## 2016-12-17 DIAGNOSIS — F419 Anxiety disorder, unspecified: Secondary | ICD-10-CM | POA: Diagnosis not present

## 2016-12-17 DIAGNOSIS — F0280 Dementia in other diseases classified elsewhere without behavioral disturbance: Secondary | ICD-10-CM | POA: Diagnosis not present

## 2016-12-17 DIAGNOSIS — J45909 Unspecified asthma, uncomplicated: Secondary | ICD-10-CM | POA: Diagnosis not present

## 2016-12-17 DIAGNOSIS — G20 Parkinson's disease: Secondary | ICD-10-CM | POA: Diagnosis not present

## 2016-12-17 DIAGNOSIS — M1990 Unspecified osteoarthritis, unspecified site: Secondary | ICD-10-CM | POA: Diagnosis not present

## 2016-12-17 DIAGNOSIS — M419 Scoliosis, unspecified: Secondary | ICD-10-CM | POA: Diagnosis not present

## 2016-12-19 DIAGNOSIS — F419 Anxiety disorder, unspecified: Secondary | ICD-10-CM | POA: Diagnosis not present

## 2016-12-19 DIAGNOSIS — M1990 Unspecified osteoarthritis, unspecified site: Secondary | ICD-10-CM | POA: Diagnosis not present

## 2016-12-19 DIAGNOSIS — J45909 Unspecified asthma, uncomplicated: Secondary | ICD-10-CM | POA: Diagnosis not present

## 2016-12-19 DIAGNOSIS — G20 Parkinson's disease: Secondary | ICD-10-CM | POA: Diagnosis not present

## 2016-12-19 DIAGNOSIS — F0280 Dementia in other diseases classified elsewhere without behavioral disturbance: Secondary | ICD-10-CM | POA: Diagnosis not present

## 2016-12-19 DIAGNOSIS — M419 Scoliosis, unspecified: Secondary | ICD-10-CM | POA: Diagnosis not present

## 2016-12-27 DIAGNOSIS — E7800 Pure hypercholesterolemia, unspecified: Secondary | ICD-10-CM | POA: Diagnosis not present

## 2016-12-27 DIAGNOSIS — M810 Age-related osteoporosis without current pathological fracture: Secondary | ICD-10-CM | POA: Diagnosis not present

## 2017-01-01 DIAGNOSIS — F4321 Adjustment disorder with depressed mood: Secondary | ICD-10-CM | POA: Diagnosis not present

## 2017-01-01 DIAGNOSIS — J309 Allergic rhinitis, unspecified: Secondary | ICD-10-CM | POA: Diagnosis not present

## 2017-01-01 DIAGNOSIS — J45909 Unspecified asthma, uncomplicated: Secondary | ICD-10-CM | POA: Diagnosis not present

## 2017-01-01 DIAGNOSIS — G20 Parkinson's disease: Secondary | ICD-10-CM | POA: Diagnosis not present

## 2017-01-01 DIAGNOSIS — I340 Nonrheumatic mitral (valve) insufficiency: Secondary | ICD-10-CM | POA: Diagnosis not present

## 2017-01-01 DIAGNOSIS — I5032 Chronic diastolic (congestive) heart failure: Secondary | ICD-10-CM | POA: Diagnosis not present

## 2017-01-01 DIAGNOSIS — Z0000 Encounter for general adult medical examination without abnormal findings: Secondary | ICD-10-CM | POA: Diagnosis not present

## 2017-01-01 DIAGNOSIS — E7800 Pure hypercholesterolemia, unspecified: Secondary | ICD-10-CM | POA: Diagnosis not present

## 2017-01-03 IMAGING — CR DG CHEST 2V
2 series · 2 of 2 positions shown · non-contrast
Comparison: 11/18/2006.

CLINICAL DATA: Initial encounter for chronic cough with increasing
shortness of breath.

EXAM:
CHEST  2 VIEW

[view not recorded (1 of 2)]
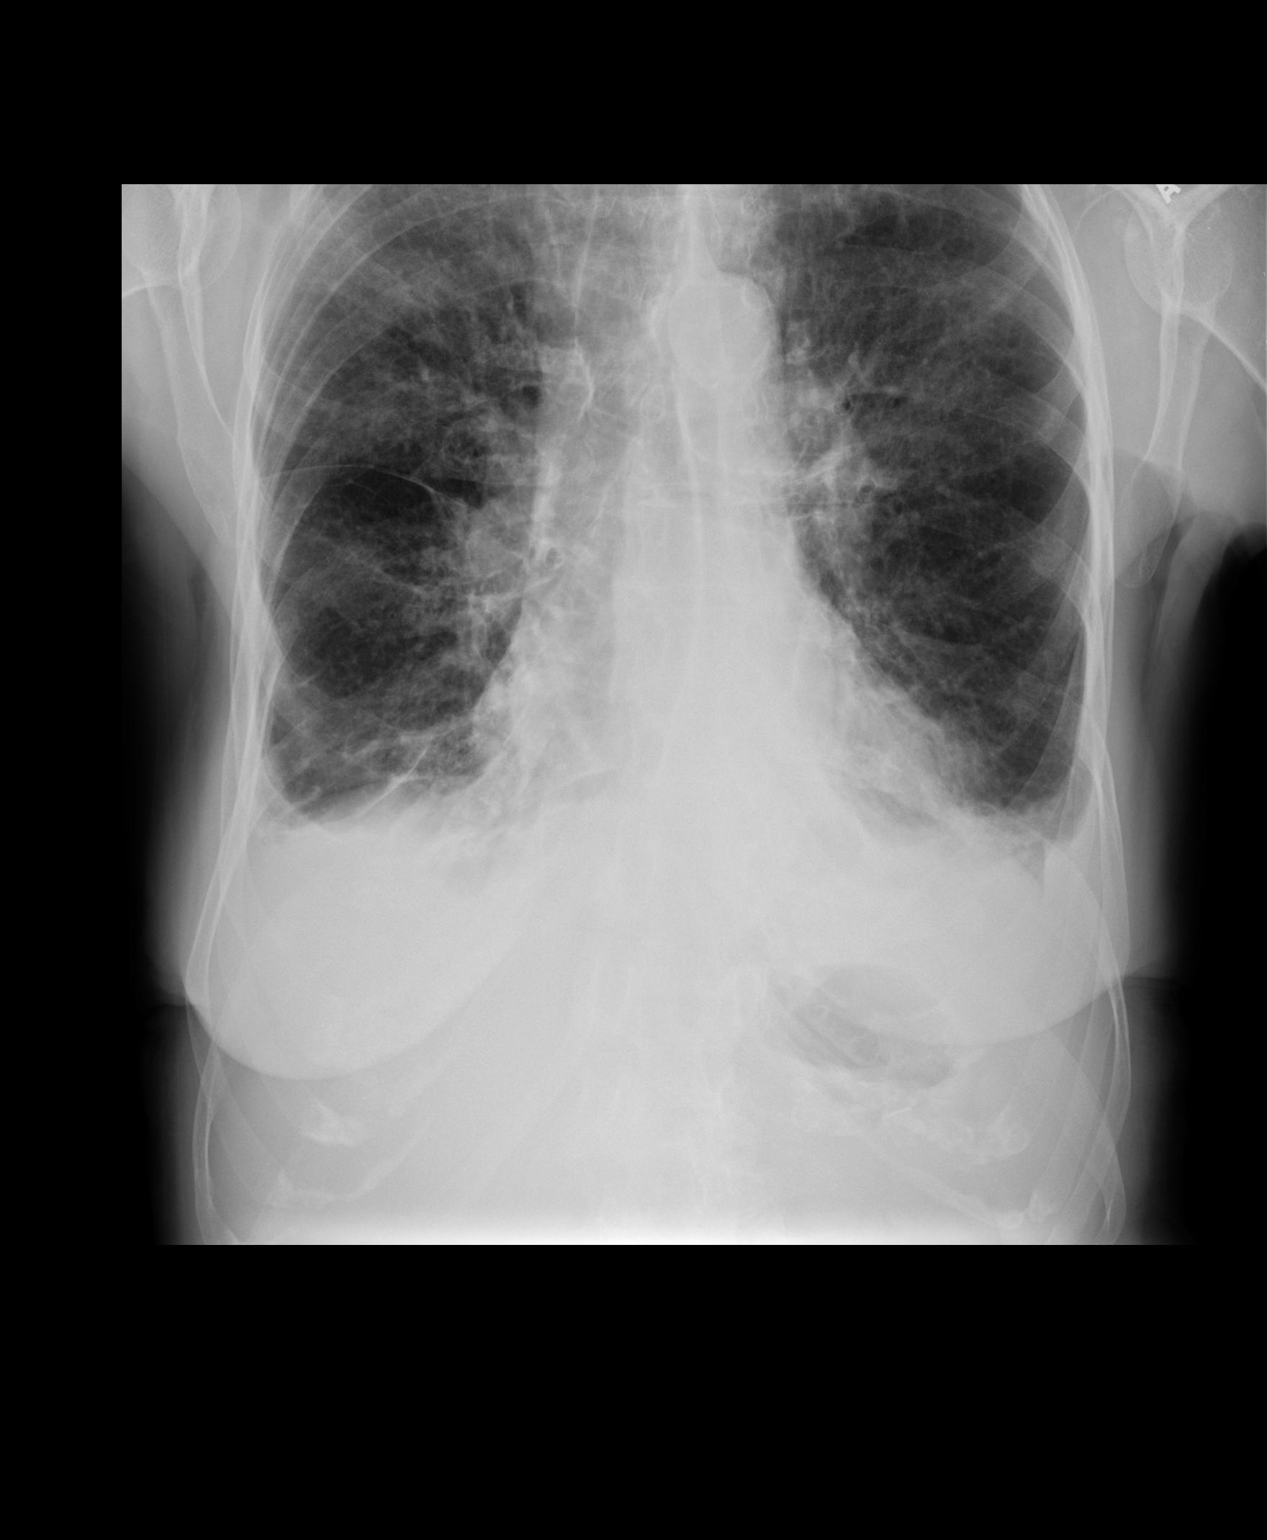

[view not recorded (2 of 2)]
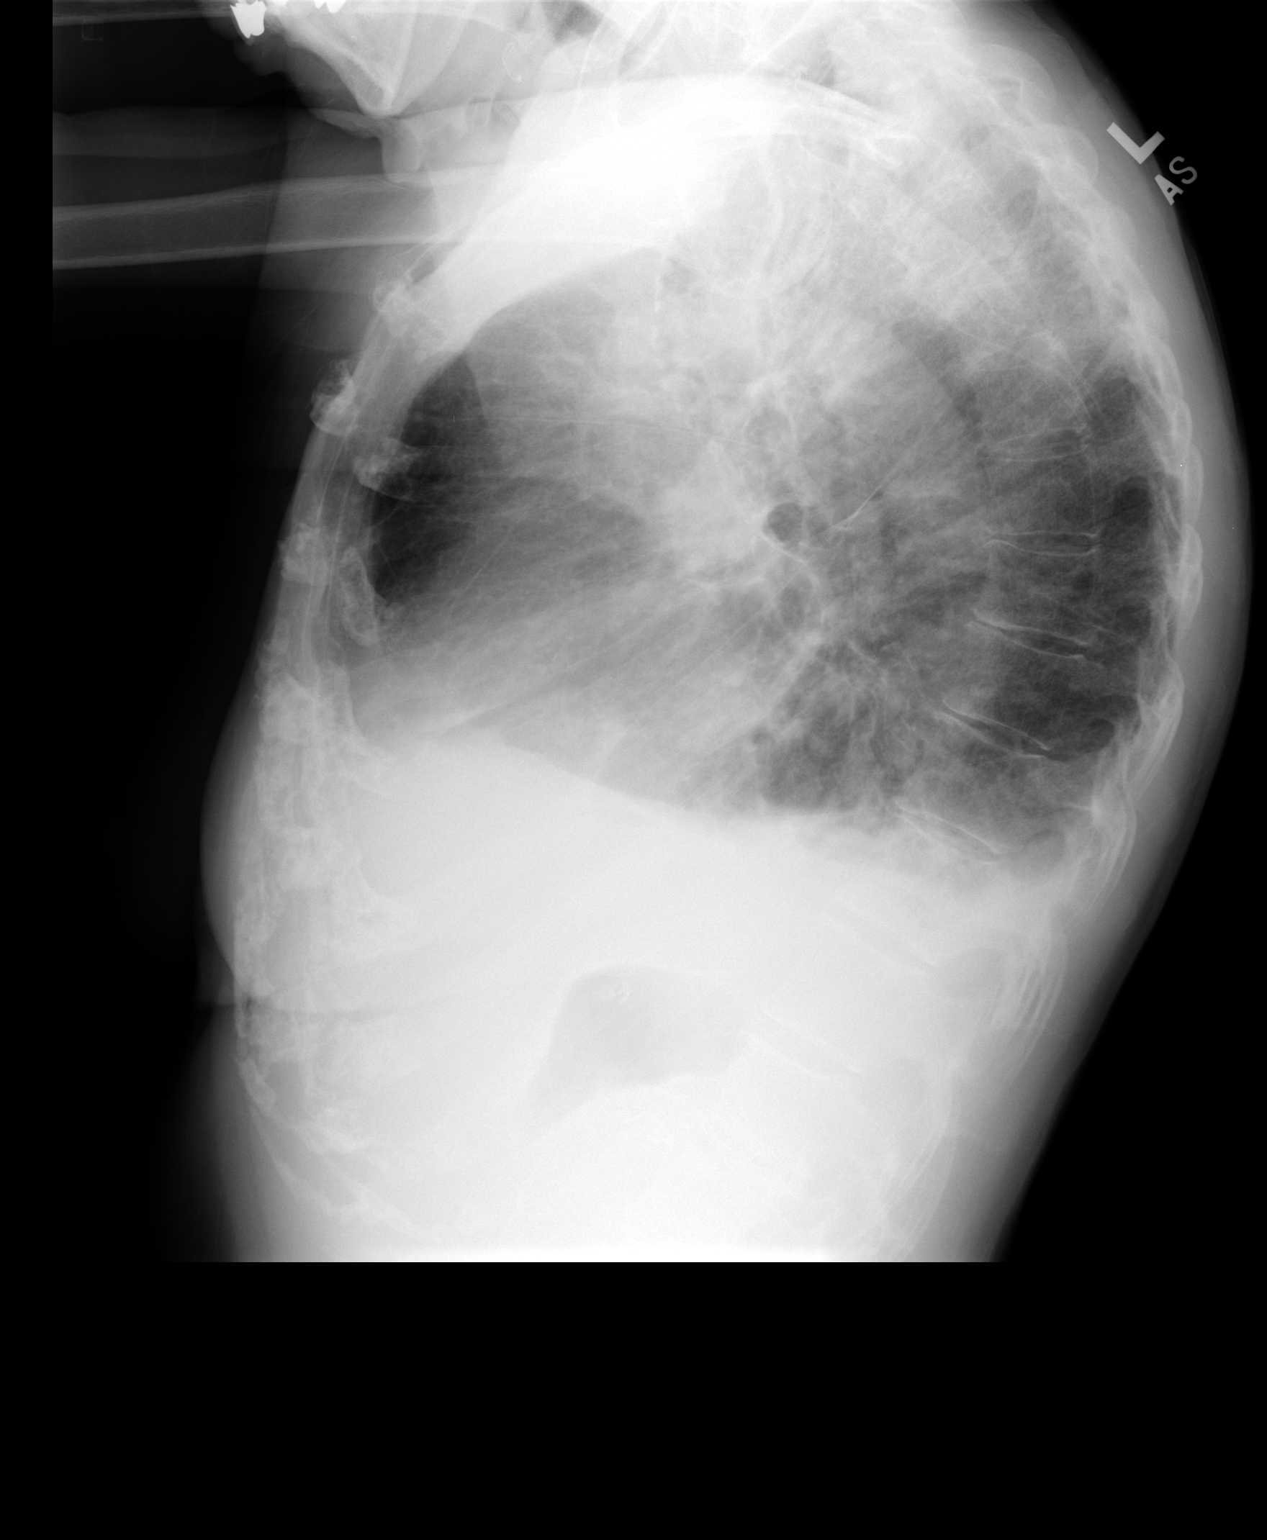

[2 of 2 positions shown; findings below may reference images not displayed]

FINDINGS: Hyperexpansion suggests emphysema. There is diffuse interstitial
opacity suggesting a degree of interstitial edema superimposed on
chronic underlying interstitial lung disease. Bibasilar atelectasis
with small bilateral pleural effusions noted. The cardio pericardial
silhouette is enlarged. Bones are diffusely demineralized.
IMPRESSION: Emphysema with cardiomegaly.

Pulmonary vascular congestion with probable interstitial pulmonary
edema.

## 2017-01-10 DIAGNOSIS — Z4682 Encounter for fitting and adjustment of non-vascular catheter: Secondary | ICD-10-CM | POA: Diagnosis not present

## 2017-01-10 DIAGNOSIS — I480 Paroxysmal atrial fibrillation: Secondary | ICD-10-CM | POA: Diagnosis not present

## 2017-01-10 DIAGNOSIS — R6521 Severe sepsis with septic shock: Secondary | ICD-10-CM | POA: Diagnosis not present

## 2017-01-10 DIAGNOSIS — T68XXXA Hypothermia, initial encounter: Secondary | ICD-10-CM | POA: Diagnosis not present

## 2017-01-10 DIAGNOSIS — R918 Other nonspecific abnormal finding of lung field: Secondary | ICD-10-CM | POA: Diagnosis not present

## 2017-01-10 DIAGNOSIS — A419 Sepsis, unspecified organism: Secondary | ICD-10-CM | POA: Diagnosis not present

## 2017-01-10 DIAGNOSIS — R0682 Tachypnea, not elsewhere classified: Secondary | ICD-10-CM | POA: Diagnosis not present

## 2017-01-11 DIAGNOSIS — R6520 Severe sepsis without septic shock: Secondary | ICD-10-CM | POA: Diagnosis not present

## 2017-01-11 DIAGNOSIS — Z8709 Personal history of other diseases of the respiratory system: Secondary | ICD-10-CM | POA: Diagnosis not present

## 2017-01-11 DIAGNOSIS — R918 Other nonspecific abnormal finding of lung field: Secondary | ICD-10-CM | POA: Diagnosis not present

## 2017-01-11 DIAGNOSIS — A419 Sepsis, unspecified organism: Secondary | ICD-10-CM | POA: Diagnosis not present

## 2017-01-11 DIAGNOSIS — Z8659 Personal history of other mental and behavioral disorders: Secondary | ICD-10-CM | POA: Diagnosis not present

## 2017-01-11 DIAGNOSIS — R4182 Altered mental status, unspecified: Secondary | ICD-10-CM | POA: Diagnosis not present

## 2017-01-11 DIAGNOSIS — E785 Hyperlipidemia, unspecified: Secondary | ICD-10-CM | POA: Diagnosis present

## 2017-01-11 DIAGNOSIS — J9602 Acute respiratory failure with hypercapnia: Secondary | ICD-10-CM | POA: Diagnosis present

## 2017-01-11 DIAGNOSIS — I700 Atherosclerosis of aorta: Secondary | ICD-10-CM | POA: Diagnosis not present

## 2017-01-11 DIAGNOSIS — J90 Pleural effusion, not elsewhere classified: Secondary | ICD-10-CM | POA: Diagnosis not present

## 2017-01-11 DIAGNOSIS — J9690 Respiratory failure, unspecified, unspecified whether with hypoxia or hypercapnia: Secondary | ICD-10-CM | POA: Diagnosis not present

## 2017-01-11 DIAGNOSIS — Z8679 Personal history of other diseases of the circulatory system: Secondary | ICD-10-CM | POA: Diagnosis not present

## 2017-01-11 DIAGNOSIS — Z4682 Encounter for fitting and adjustment of non-vascular catheter: Secondary | ICD-10-CM | POA: Diagnosis not present

## 2017-01-11 DIAGNOSIS — Z8739 Personal history of other diseases of the musculoskeletal system and connective tissue: Secondary | ICD-10-CM | POA: Diagnosis not present

## 2017-01-11 DIAGNOSIS — J9601 Acute respiratory failure with hypoxia: Secondary | ICD-10-CM | POA: Diagnosis not present

## 2017-01-11 DIAGNOSIS — T68XXXA Hypothermia, initial encounter: Secondary | ICD-10-CM | POA: Diagnosis not present

## 2017-01-11 DIAGNOSIS — R4020 Unspecified coma: Secondary | ICD-10-CM | POA: Diagnosis not present

## 2017-01-11 DIAGNOSIS — Z66 Do not resuscitate: Secondary | ICD-10-CM | POA: Diagnosis not present

## 2017-01-11 DIAGNOSIS — Z8701 Personal history of pneumonia (recurrent): Secondary | ICD-10-CM | POA: Diagnosis not present

## 2017-01-11 DIAGNOSIS — I480 Paroxysmal atrial fibrillation: Secondary | ICD-10-CM | POA: Diagnosis not present

## 2017-01-11 DIAGNOSIS — K219 Gastro-esophageal reflux disease without esophagitis: Secondary | ICD-10-CM | POA: Diagnosis present

## 2017-01-11 DIAGNOSIS — Z79899 Other long term (current) drug therapy: Secondary | ICD-10-CM | POA: Diagnosis not present

## 2017-01-11 DIAGNOSIS — I509 Heart failure, unspecified: Secondary | ICD-10-CM | POA: Diagnosis not present

## 2017-01-11 DIAGNOSIS — R680 Hypothermia, not associated with low environmental temperature: Secondary | ICD-10-CM | POA: Diagnosis not present

## 2017-01-11 DIAGNOSIS — Z7982 Long term (current) use of aspirin: Secondary | ICD-10-CM | POA: Diagnosis not present

## 2017-01-11 DIAGNOSIS — G20 Parkinson's disease: Secondary | ICD-10-CM | POA: Diagnosis present

## 2017-01-11 DIAGNOSIS — R0989 Other specified symptoms and signs involving the circulatory and respiratory systems: Secondary | ICD-10-CM | POA: Diagnosis not present

## 2017-01-11 DIAGNOSIS — J45909 Unspecified asthma, uncomplicated: Secondary | ICD-10-CM | POA: Diagnosis present

## 2017-01-11 DIAGNOSIS — I4891 Unspecified atrial fibrillation: Secondary | ICD-10-CM | POA: Diagnosis present

## 2017-01-11 DIAGNOSIS — I5032 Chronic diastolic (congestive) heart failure: Secondary | ICD-10-CM | POA: Diagnosis present

## 2017-01-11 DIAGNOSIS — Z515 Encounter for palliative care: Secondary | ICD-10-CM | POA: Diagnosis not present

## 2017-01-11 DIAGNOSIS — R6521 Severe sepsis with septic shock: Secondary | ICD-10-CM | POA: Diagnosis not present

## 2017-01-11 DIAGNOSIS — Z9911 Dependence on respirator [ventilator] status: Secondary | ICD-10-CM | POA: Diagnosis not present

## 2017-01-11 DIAGNOSIS — J159 Unspecified bacterial pneumonia: Secondary | ICD-10-CM | POA: Diagnosis not present

## 2017-02-09 DEATH — deceased

## 2017-03-28 NOTE — Progress Notes (Deleted)
Jill Hamilton was seen today in the movement disorders clinic for neurologic consultation at the request of Avva, Ravisankar, MD.  The consultation is for the evaluation of Parkinsons disease.  Pt currently seeing Dr. Rexene Hamilton.  Prior to that she saw Dr. Erling Hamilton.  The records that were made available to me were reviewed.  While Dr. Guadelupe Hamilton records are present, Dr. Bernardo Hamilton records are not.  This patient is accompanied in the office by her spouse who supplements the history.   Records indicate that the patient was diagnosed with an unknown gait disorder in 2004 and Parkinson's disease in 2006.  She was initially started on levodopa and then ropinirole was later added.  She did develop dyskinesias, for which amantadine was used.  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  At some point in time, she was changed to stalevo and is currently on stalevo 150 mg at 7am/10am/1pm/4pm/7pm (takes with ensure).  She is on requip 0.25 mg q hs.  She was last seen by Dr. Rexene Hamilton in 04/2016.  04/01/17 update:  Patient seen today in follow-up.  She is currently on stalevo 150 mg at 7am/10am/1pm/4pm/7pm.  I did ask her to not take this with ensure, which she was previously doing.  She is still on Requip, 0.25 mg at night for restless leg.  No hallucinations or sleep attacks.  She has had no falls.  No lightheadedness or near syncope.  I did refer her for home physical therapy last visit and ***.  Neuroimaging has previously been performed.  It is available for my review today.  CT brain done on 06/28/16 was unremarkable and just demonstrated mild wmd.  PREVIOUS MEDICATIONS:  amantadine (developed livedo reticularis)  ALLERGIES:   Allergies  Allergen Reactions  . Iodine Hives  . Iohexol Hives     Code: HIVES, Desc: reaction after a heart cath per pt--needs 13 hour prep, Onset Date: [id number]     CURRENT MEDICATIONS:  Outpatient Encounter Prescriptions as of 04/01/2017  Medication Sig  . albuterol (PROVENTIL  HFA;VENTOLIN HFA) 108 (90 BASE) MCG/ACT inhaler Inhale 2 puffs into the lungs every 6 (six) hours as needed. For shortness of breath  . ALPRAZolam (XANAX) 0.5 MG tablet Take 1 tablet (0.5 mg total) by mouth as needed for anxiety. (Patient taking differently: Take 0.5 mg by mouth daily as needed for anxiety. )  . brimonidine (ALPHAGAN) 0.15 % ophthalmic solution Place 1 drop into both eyes daily.  . carbidopa-levodopa-entacapone (STALEVO) 37.5-150-200 MG tablet TAKE ONE TABLET FIVE TIMES A DAY. 7AM, 10AM, 1PM, AT 4 PM, AND 7PM  . Cholecalciferol (VITAMIN D-3 PO) Take 1 capsule by mouth every evening.  . citalopram (CELEXA) 40 MG tablet TAKE ONE TABLET BY MOUTH EVERY MORNING  . ezetimibe (ZETIA) 10 MG tablet Take 10 mg by mouth at bedtime.   . fluticasone (FLONASE) 50 MCG/ACT nasal spray Place 1 spray into both nostrils daily as needed for allergies or rhinitis.   . furosemide (LASIX) 40 MG tablet Take 1 tablet (40 mg total) by mouth daily.  Marland Kitchen oxyCODONE (OXY IR/ROXICODONE) 5 MG immediate release tablet Take 0.5-1 tablets (2.5-5 mg total) by mouth daily as needed for severe pain.  . potassium chloride SA (K-DUR,KLOR-CON) 20 MEQ tablet Take 20 mEq by mouth daily.  Marland Kitchen PREVIDENT 5000 DRY MOUTH 1.1 % GEL dental gel See admin instructions. Use as directed  . rOPINIRole (REQUIP) 0.25 MG tablet Take 0.25 mg by mouth at bedtime as needed (for sleep).   Jill Hamilton  Z 0.004 % SOLN ophthalmic solution Place 1 drop into both eyes daily.   . vitamin C (ASCORBIC ACID) 500 MG tablet Take 500 mg by mouth every evening.   No facility-administered encounter medications on file as of 04/01/2017.     PAST MEDICAL HISTORY:   Past Medical History:  Diagnosis Date  . Anxiety   . Arthritis   . Asthma   . H/O seasonal allergies   . Hyperlipidemia   . Mitral valve prolapse   . Moderate mitral insufficiency   . Osteoporosis   . Parkinson's disease ([place])   . Scoliosis     PAST SURGICAL HISTORY:   Past Surgical  History:  Procedure Laterality Date  . ABDOMINAL HYSTERECTOMY    . APPENDECTOMY     with hysterectomy  . BLADDER SURGERY     bladder tacked  . CARPAL TUNNEL RELEASE     bilateral  . CHOLECYSTECTOMY    . ESOPHAGOGASTRODUODENOSCOPY  08/12/12  . HAMMER TOE SURGERY     bilateral  . HIATAL HERNIA REPAIR  12/15/2012   laparascopic repair  . HIATAL HERNIA REPAIR  12/15/2012   Procedure: LAPAROSCOPIC REPAIR OF HIATAL HERNIA;  Surgeon: Jill Earls, MD;  Location: [facility];  Service: General;;    SOCIAL HISTORY:   Social History   Social History  . Marital status: Married    Spouse name: Jill Hamilton  . Number of children: 1  . Years of education: 17   Occupational History  . retired     Merchant [employer] officer   Social History Main Topics  . Smoking status: Never Smoker  . Smokeless tobacco: Never Used  . Alcohol use No  . Drug use: No  . Sexual activity: Not on file   Other Topics Concern  . Not on file   Social History Narrative  . No narrative on file    FAMILY HISTORY:   Family Status  Relation Status  . Father Deceased at age 48       heart attack  . Mother Deceased at age 76       cerebral hemmorrhage  . Brother Alive  . Son Alive    ROS:  A complete 10 system review of systems was obtained and was unremarkable apart from what is mentioned above.  PHYSICAL EXAMINATION:    VITALS:   There were no vitals filed for this visit.  GEN:  The patient appears stated age and is in NAD. HEENT:  Normocephalic, atraumatic.  The mucous membranes are dry. The superficial temporal arteries are without ropiness or tenderness. CV:  RRR Lungs:  CTAB Neck/HEME:  There are no carotid bruits bilaterally.  Neurological examination:  Orientation:  Montreal Cognitive Assessment  10/31/2016  Visuospatial/ Executive (0/5) 1  Naming (0/3) 2  Attention: Read list of digits (0/2) 1  Attention: Read list of letters (0/1) 0  Attention: Serial 7 subtraction starting at 100 (0/3) 1    Language: Repeat phrase (0/2) 2  Language : Fluency (0/1) 0  Abstraction (0/2) 1  Delayed Recall (0/5) 1  Orientation (0/6) 5  Total 14  Adjusted Score (based on education) 14   Cranial nerves: There is good facial symmetry. Lips are parted.Pupils are equal round and reactive to light bilaterally. Fundoscopic exam reveals clear margins bilaterally. Extraocular muscles are intact. The visual fields are full to confrontational testing. The speech is fluent and clear. Soft palate rises symmetrically and there is no tongue deviation. Hearing is intact to conversational tone. Sensation: Sensation  is intact to light and pinprick throughout (facial, trunk, extremities). Vibration is intact at the bilateral big toe. There is no extinction with double simultaneous stimulation. There is no sensory dermatomal level identified. Motor: Strength is 5/5 in the bilateral upper and lower extremities.   Grip strength is decreased bilaterally due to arthritis.  Shoulder shrug is equal and symmetric.  There is no pronator drift. Deep tendon reflexes: Deep tendon reflexes are 1/4 at the bilateral biceps, triceps, brachioradialis, patella and achilles. There is a striatal toe on the right.  Plantar response on the L is neutral.    Movement examination: Tone: There is slight increased tone in the RUE but otherwise tone is normal.   Abnormal movements: There is mild dyskinesia in the head and bilateral lower extremities, L more than R Coordination:  There is decremation with RAM's, with finger taps bilaterally, R more than L.  There is decremation with hand opening and closing bilaterally, right more than L.  Significant trouble with toe taps on the right.  Heel taps are good bilaterally.   Gait and Station: The patient has difficulty arising out of a deep-seated chair without the use of the hands but she does push off of the chair and then walks well down the hall.  She has slight pisa syndrome to the right. The  patient's stride length is slightly decreased.  She is unstable.  Labs 01/02/17:  Sodium was 141, potassium 4.3, chloride 106, CO2 24, BUN 24, creatinine 1.0, AST 34, ALT 7, alkaline phosphatase 111.  Lipitor 6.3, hemoglobin 12.2, hematocrit 40.6 and platelets 191.  Total cholesterol 138, HDL of 50, LDL 77, TSH 3.16  ASSESSMENT/PLAN:  1.  Idiopathic Parkinson's disease.  The patient was diagnosed in 2006 by Dr. Erling Hamilton.  -We discussed the diagnosis as well as pathophysiology of the disease.  We discussed treatment options as well as prognostic indicators.  Patient education was provided.  -Greater than 50% of the 60 minute visit was spent in counseling answering questions and talking about what to expect now as well as in the future.  We talked about medication options.  She is near optimally treated currently.  We talked about safety in the home.  -She will continue stalevo 150 mg at 7am/10am/1pm/4pm/7pm.    -For now, she will continue the very low dose of Requip, 0.25 mg at night for restless legs.  I do not think this small doses interfering with cognition and she thinks it is very helpful for restless legs.  -We discussed community resources in the area including patient support groups and community exercise programs for PD and pt education was provided to the patient.  She attends  support group.  Otherwise, she gets out of the home very little.  2.  Parkinson's dementia  -Overall, the patient has good support in the home.  She is living in an assisted living facility, although fairly independently.  Her husband is taking good care of her currently.  She is not driving.  He is monitoring her medications.   3.  I will plan on seeing her back in follow-up in the next 5 months, sooner should new neurologic issues arise.  Cc:  Prince Solian, MD

## 2017-04-01 ENCOUNTER — Ambulatory Visit: Payer: Medicare Other | Admitting: Neurology

## 2017-04-01 DIAGNOSIS — Z029 Encounter for administrative examinations, unspecified: Secondary | ICD-10-CM

## 2017-04-02 ENCOUNTER — Encounter: Payer: Self-pay | Admitting: Neurology

## 2024-03-25 ENCOUNTER — Other Ambulatory Visit: Payer: Self-pay

## 2024-03-30 ENCOUNTER — Other Ambulatory Visit (HOSPITAL_COMMUNITY): Payer: Self-pay
